# Patient Record
Sex: Female | Born: 1955 | Race: White | Hispanic: No | Smoking: Former smoker
Health system: Southern US, Community
[De-identification: ages and names within clinical notes are randomized; demographics above are authoritative.]

## PROBLEM LIST (undated history)

## (undated) DIAGNOSIS — I10 Essential (primary) hypertension: Secondary | ICD-10-CM

## (undated) DIAGNOSIS — G589 Mononeuropathy, unspecified: Secondary | ICD-10-CM

## (undated) DIAGNOSIS — B20 Human immunodeficiency virus [HIV] disease: Secondary | ICD-10-CM

## (undated) DIAGNOSIS — R079 Chest pain, unspecified: Secondary | ICD-10-CM

## (undated) DIAGNOSIS — E785 Hyperlipidemia, unspecified: Secondary | ICD-10-CM

## (undated) HISTORY — PX: BACK SURGERY: SHX140

## (undated) HISTORY — PX: BLADDER SUSPENSION: SHX72

## (undated) HISTORY — DX: Hyperlipidemia, unspecified: E78.5

## (undated) HISTORY — PX: ABDOMINAL HYSTERECTOMY: SHX81

## (undated) HISTORY — DX: Chest pain, unspecified: R07.9

---

## 1993-07-26 ENCOUNTER — Encounter (INDEPENDENT_AMBULATORY_CARE_PROVIDER_SITE_OTHER): Payer: Self-pay | Admitting: *Deleted

## 1993-07-26 LAB — CONVERTED CEMR LAB: CD4 Count: 190 microliters

## 1998-04-06 ENCOUNTER — Encounter: Admission: RE | Admit: 1998-04-06 | Discharge: 1998-04-06 | Payer: Self-pay | Admitting: Infectious Diseases

## 1998-04-24 ENCOUNTER — Encounter: Admission: RE | Admit: 1998-04-24 | Discharge: 1998-04-24 | Payer: Self-pay | Admitting: Infectious Diseases

## 1998-05-12 ENCOUNTER — Encounter: Admission: RE | Admit: 1998-05-12 | Discharge: 1998-05-12 | Payer: Self-pay | Admitting: Infectious Diseases

## 1998-05-27 ENCOUNTER — Encounter: Admission: RE | Admit: 1998-05-27 | Discharge: 1998-05-27 | Payer: Self-pay | Admitting: Infectious Diseases

## 1998-07-08 ENCOUNTER — Encounter: Admission: RE | Admit: 1998-07-08 | Discharge: 1998-07-08 | Payer: Self-pay | Admitting: Infectious Diseases

## 1998-08-19 ENCOUNTER — Inpatient Hospital Stay (HOSPITAL_COMMUNITY): Admission: RE | Admit: 1998-08-19 | Discharge: 1998-08-21 | Payer: Self-pay | Admitting: Infectious Diseases

## 1998-09-02 ENCOUNTER — Encounter: Admission: RE | Admit: 1998-09-02 | Discharge: 1998-09-02 | Payer: Self-pay | Admitting: Infectious Diseases

## 1998-10-12 ENCOUNTER — Encounter: Admission: RE | Admit: 1998-10-12 | Discharge: 1998-10-12 | Payer: Self-pay | Admitting: Infectious Diseases

## 1998-11-30 ENCOUNTER — Encounter: Admission: RE | Admit: 1998-11-30 | Discharge: 1998-11-30 | Payer: Self-pay | Admitting: Infectious Diseases

## 1998-12-14 ENCOUNTER — Encounter: Admission: RE | Admit: 1998-12-14 | Discharge: 1998-12-14 | Payer: Self-pay | Admitting: Infectious Diseases

## 1999-01-25 ENCOUNTER — Ambulatory Visit (HOSPITAL_COMMUNITY): Admission: RE | Admit: 1999-01-25 | Discharge: 1999-01-25 | Payer: Self-pay | Admitting: Infectious Diseases

## 1999-01-25 ENCOUNTER — Encounter: Payer: Self-pay | Admitting: Infectious Diseases

## 1999-01-25 ENCOUNTER — Encounter: Admission: RE | Admit: 1999-01-25 | Discharge: 1999-01-25 | Payer: Self-pay | Admitting: Infectious Diseases

## 1999-01-27 ENCOUNTER — Encounter: Payer: Self-pay | Admitting: Neurosurgery

## 1999-01-29 ENCOUNTER — Encounter: Payer: Self-pay | Admitting: Neurosurgery

## 1999-01-29 ENCOUNTER — Inpatient Hospital Stay (HOSPITAL_COMMUNITY): Admission: RE | Admit: 1999-01-29 | Discharge: 1999-01-30 | Payer: Self-pay | Admitting: Neurosurgery

## 1999-02-17 ENCOUNTER — Encounter: Admission: RE | Admit: 1999-02-17 | Discharge: 1999-02-17 | Payer: Self-pay | Admitting: Infectious Diseases

## 1999-03-09 ENCOUNTER — Ambulatory Visit (HOSPITAL_COMMUNITY): Admission: RE | Admit: 1999-03-09 | Discharge: 1999-03-09 | Payer: Self-pay | Admitting: Infectious Diseases

## 1999-03-22 ENCOUNTER — Encounter: Admission: RE | Admit: 1999-03-22 | Discharge: 1999-03-22 | Payer: Self-pay | Admitting: Infectious Diseases

## 1999-05-25 ENCOUNTER — Ambulatory Visit (HOSPITAL_COMMUNITY): Admission: RE | Admit: 1999-05-25 | Discharge: 1999-05-25 | Payer: Self-pay | Admitting: Infectious Diseases

## 1999-06-07 ENCOUNTER — Ambulatory Visit (HOSPITAL_COMMUNITY): Admission: RE | Admit: 1999-06-07 | Discharge: 1999-06-07 | Payer: Self-pay | Admitting: Infectious Diseases

## 1999-06-07 ENCOUNTER — Encounter: Admission: RE | Admit: 1999-06-07 | Discharge: 1999-06-07 | Payer: Self-pay | Admitting: Infectious Diseases

## 1999-07-27 ENCOUNTER — Encounter: Admission: RE | Admit: 1999-07-27 | Discharge: 1999-07-27 | Payer: Self-pay | Admitting: Infectious Diseases

## 1999-07-27 ENCOUNTER — Ambulatory Visit (HOSPITAL_COMMUNITY): Admission: RE | Admit: 1999-07-27 | Discharge: 1999-07-27 | Payer: Self-pay | Admitting: Infectious Diseases

## 1999-08-16 ENCOUNTER — Encounter: Admission: RE | Admit: 1999-08-16 | Discharge: 1999-08-16 | Payer: Self-pay | Admitting: Infectious Diseases

## 1999-10-21 ENCOUNTER — Ambulatory Visit (HOSPITAL_COMMUNITY): Admission: RE | Admit: 1999-10-21 | Discharge: 1999-10-21 | Payer: Self-pay | Admitting: Infectious Diseases

## 1999-10-21 ENCOUNTER — Encounter: Admission: RE | Admit: 1999-10-21 | Discharge: 1999-10-21 | Payer: Self-pay | Admitting: Infectious Diseases

## 1999-11-08 ENCOUNTER — Encounter: Admission: RE | Admit: 1999-11-08 | Discharge: 1999-11-08 | Payer: Self-pay | Admitting: Infectious Diseases

## 2000-02-01 ENCOUNTER — Encounter: Admission: RE | Admit: 2000-02-01 | Discharge: 2000-02-01 | Payer: Self-pay | Admitting: Infectious Diseases

## 2000-02-01 ENCOUNTER — Ambulatory Visit (HOSPITAL_COMMUNITY): Admission: RE | Admit: 2000-02-01 | Discharge: 2000-02-01 | Payer: Self-pay | Admitting: Infectious Diseases

## 2000-02-14 ENCOUNTER — Encounter: Admission: RE | Admit: 2000-02-14 | Discharge: 2000-02-14 | Payer: Self-pay | Admitting: Infectious Diseases

## 2000-04-12 ENCOUNTER — Encounter: Admission: RE | Admit: 2000-04-12 | Discharge: 2000-04-12 | Payer: Self-pay | Admitting: Infectious Diseases

## 2000-04-12 ENCOUNTER — Ambulatory Visit (HOSPITAL_COMMUNITY): Admission: RE | Admit: 2000-04-12 | Discharge: 2000-04-12 | Payer: Self-pay | Admitting: Infectious Diseases

## 2000-04-26 ENCOUNTER — Encounter: Admission: RE | Admit: 2000-04-26 | Discharge: 2000-04-26 | Payer: Self-pay | Admitting: Infectious Diseases

## 2000-05-03 ENCOUNTER — Encounter: Admission: RE | Admit: 2000-05-03 | Discharge: 2000-05-03 | Payer: Self-pay | Admitting: Infectious Diseases

## 2000-05-08 ENCOUNTER — Encounter: Admission: RE | Admit: 2000-05-08 | Discharge: 2000-05-08 | Payer: Self-pay | Admitting: Infectious Diseases

## 2000-07-12 ENCOUNTER — Ambulatory Visit (HOSPITAL_COMMUNITY): Admission: RE | Admit: 2000-07-12 | Discharge: 2000-07-12 | Payer: Self-pay | Admitting: Infectious Diseases

## 2000-07-12 ENCOUNTER — Encounter: Admission: RE | Admit: 2000-07-12 | Discharge: 2000-07-12 | Payer: Self-pay | Admitting: Infectious Diseases

## 2000-07-31 ENCOUNTER — Encounter: Admission: RE | Admit: 2000-07-31 | Discharge: 2000-07-31 | Payer: Self-pay | Admitting: Infectious Diseases

## 2000-09-11 ENCOUNTER — Encounter: Admission: RE | Admit: 2000-09-11 | Discharge: 2000-09-11 | Payer: Self-pay | Admitting: Infectious Diseases

## 2000-09-11 ENCOUNTER — Ambulatory Visit (HOSPITAL_COMMUNITY): Admission: RE | Admit: 2000-09-11 | Discharge: 2000-09-11 | Payer: Self-pay | Admitting: Infectious Diseases

## 2000-09-25 ENCOUNTER — Encounter: Admission: RE | Admit: 2000-09-25 | Discharge: 2000-09-25 | Payer: Self-pay | Admitting: Infectious Diseases

## 2000-10-17 ENCOUNTER — Encounter: Admission: RE | Admit: 2000-10-17 | Discharge: 2000-10-17 | Payer: Self-pay | Admitting: Hematology and Oncology

## 2000-12-11 ENCOUNTER — Encounter: Admission: RE | Admit: 2000-12-11 | Discharge: 2000-12-11 | Payer: Self-pay | Admitting: Internal Medicine

## 2000-12-11 ENCOUNTER — Ambulatory Visit (HOSPITAL_COMMUNITY): Admission: RE | Admit: 2000-12-11 | Discharge: 2000-12-11 | Payer: Self-pay | Admitting: Infectious Diseases

## 2000-12-27 ENCOUNTER — Encounter: Admission: RE | Admit: 2000-12-27 | Discharge: 2000-12-27 | Payer: Self-pay | Admitting: Infectious Diseases

## 2001-02-08 ENCOUNTER — Encounter: Admission: RE | Admit: 2001-02-08 | Discharge: 2001-02-08 | Payer: Self-pay

## 2001-03-20 ENCOUNTER — Encounter: Admission: RE | Admit: 2001-03-20 | Discharge: 2001-03-20 | Payer: Self-pay | Admitting: Infectious Diseases

## 2001-03-20 ENCOUNTER — Ambulatory Visit (HOSPITAL_COMMUNITY): Admission: RE | Admit: 2001-03-20 | Discharge: 2001-03-20 | Payer: Self-pay | Admitting: Infectious Diseases

## 2001-04-06 ENCOUNTER — Encounter: Admission: RE | Admit: 2001-04-06 | Discharge: 2001-04-06 | Payer: Self-pay

## 2001-04-20 ENCOUNTER — Encounter: Admission: RE | Admit: 2001-04-20 | Discharge: 2001-04-20 | Payer: Self-pay | Admitting: Infectious Diseases

## 2001-05-21 ENCOUNTER — Encounter: Admission: RE | Admit: 2001-05-21 | Discharge: 2001-05-21 | Payer: Self-pay | Admitting: Infectious Diseases

## 2001-07-02 ENCOUNTER — Encounter: Admission: RE | Admit: 2001-07-02 | Discharge: 2001-07-02 | Payer: Self-pay | Admitting: Infectious Diseases

## 2001-07-02 ENCOUNTER — Ambulatory Visit (HOSPITAL_COMMUNITY): Admission: RE | Admit: 2001-07-02 | Discharge: 2001-07-02 | Payer: Self-pay | Admitting: Infectious Diseases

## 2001-07-16 ENCOUNTER — Encounter: Admission: RE | Admit: 2001-07-16 | Discharge: 2001-07-16 | Payer: Self-pay | Admitting: Infectious Diseases

## 2001-10-01 ENCOUNTER — Ambulatory Visit (HOSPITAL_COMMUNITY): Admission: RE | Admit: 2001-10-01 | Discharge: 2001-10-01 | Payer: Self-pay | Admitting: Infectious Diseases

## 2001-10-01 ENCOUNTER — Encounter: Admission: RE | Admit: 2001-10-01 | Discharge: 2001-10-01 | Payer: Self-pay | Admitting: Infectious Diseases

## 2001-10-15 ENCOUNTER — Encounter: Admission: RE | Admit: 2001-10-15 | Discharge: 2001-10-15 | Payer: Self-pay | Admitting: Infectious Diseases

## 2002-01-14 ENCOUNTER — Encounter: Admission: RE | Admit: 2002-01-14 | Discharge: 2002-01-14 | Payer: Self-pay | Admitting: Infectious Diseases

## 2002-01-14 ENCOUNTER — Ambulatory Visit (HOSPITAL_COMMUNITY): Admission: RE | Admit: 2002-01-14 | Discharge: 2002-01-14 | Payer: Self-pay | Admitting: Infectious Diseases

## 2002-01-28 ENCOUNTER — Encounter: Admission: RE | Admit: 2002-01-28 | Discharge: 2002-01-28 | Payer: Self-pay | Admitting: Infectious Diseases

## 2002-02-01 ENCOUNTER — Encounter: Payer: Self-pay | Admitting: Infectious Diseases

## 2002-02-01 ENCOUNTER — Ambulatory Visit (HOSPITAL_COMMUNITY): Admission: RE | Admit: 2002-02-01 | Discharge: 2002-02-01 | Payer: Self-pay | Admitting: Infectious Diseases

## 2002-02-18 ENCOUNTER — Encounter: Admission: RE | Admit: 2002-02-18 | Discharge: 2002-02-18 | Payer: Self-pay | Admitting: Infectious Diseases

## 2002-02-27 ENCOUNTER — Other Ambulatory Visit: Admission: RE | Admit: 2002-02-27 | Discharge: 2002-02-27 | Payer: Self-pay | Admitting: Obstetrics and Gynecology

## 2002-03-18 ENCOUNTER — Encounter: Admission: RE | Admit: 2002-03-18 | Discharge: 2002-03-18 | Payer: Self-pay | Admitting: Infectious Diseases

## 2002-05-14 ENCOUNTER — Ambulatory Visit (HOSPITAL_COMMUNITY): Admission: RE | Admit: 2002-05-14 | Discharge: 2002-05-14 | Payer: Self-pay | Admitting: Infectious Diseases

## 2002-05-14 ENCOUNTER — Encounter: Admission: RE | Admit: 2002-05-14 | Discharge: 2002-05-14 | Payer: Self-pay | Admitting: Infectious Diseases

## 2002-05-15 ENCOUNTER — Ambulatory Visit (HOSPITAL_COMMUNITY): Admission: RE | Admit: 2002-05-15 | Discharge: 2002-05-15 | Payer: Self-pay | Admitting: Infectious Diseases

## 2002-05-15 ENCOUNTER — Encounter: Payer: Self-pay | Admitting: Infectious Diseases

## 2002-06-03 ENCOUNTER — Encounter: Admission: RE | Admit: 2002-06-03 | Discharge: 2002-06-03 | Payer: Self-pay | Admitting: Infectious Diseases

## 2002-09-02 ENCOUNTER — Ambulatory Visit (HOSPITAL_COMMUNITY): Admission: RE | Admit: 2002-09-02 | Discharge: 2002-09-02 | Payer: Self-pay | Admitting: Infectious Diseases

## 2002-09-02 ENCOUNTER — Encounter: Admission: RE | Admit: 2002-09-02 | Discharge: 2002-09-02 | Payer: Self-pay | Admitting: Internal Medicine

## 2002-09-16 ENCOUNTER — Encounter: Admission: RE | Admit: 2002-09-16 | Discharge: 2002-09-16 | Payer: Self-pay | Admitting: Infectious Diseases

## 2002-10-18 ENCOUNTER — Encounter: Admission: RE | Admit: 2002-10-18 | Discharge: 2002-10-18 | Payer: Self-pay | Admitting: Internal Medicine

## 2002-12-30 ENCOUNTER — Encounter: Admission: RE | Admit: 2002-12-30 | Discharge: 2002-12-30 | Payer: Self-pay | Admitting: Infectious Diseases

## 2002-12-30 ENCOUNTER — Ambulatory Visit (HOSPITAL_COMMUNITY): Admission: RE | Admit: 2002-12-30 | Discharge: 2002-12-30 | Payer: Self-pay | Admitting: Infectious Diseases

## 2003-01-13 ENCOUNTER — Encounter: Admission: RE | Admit: 2003-01-13 | Discharge: 2003-01-13 | Payer: Self-pay | Admitting: Infectious Diseases

## 2003-05-13 ENCOUNTER — Encounter (INDEPENDENT_AMBULATORY_CARE_PROVIDER_SITE_OTHER): Payer: Self-pay | Admitting: Infectious Diseases

## 2003-05-13 ENCOUNTER — Encounter: Admission: RE | Admit: 2003-05-13 | Discharge: 2003-05-13 | Payer: Self-pay | Admitting: Infectious Diseases

## 2003-06-04 ENCOUNTER — Encounter: Admission: RE | Admit: 2003-06-04 | Discharge: 2003-06-04 | Payer: Self-pay | Admitting: Infectious Diseases

## 2003-06-04 ENCOUNTER — Encounter: Payer: Self-pay | Admitting: Neurosurgery

## 2003-06-04 ENCOUNTER — Ambulatory Visit (HOSPITAL_COMMUNITY): Admission: RE | Admit: 2003-06-04 | Discharge: 2003-06-04 | Payer: Self-pay | Admitting: Neurosurgery

## 2003-07-18 ENCOUNTER — Observation Stay (HOSPITAL_COMMUNITY): Admission: RE | Admit: 2003-07-18 | Discharge: 2003-07-19 | Payer: Self-pay | Admitting: Neurosurgery

## 2003-07-18 ENCOUNTER — Encounter: Payer: Self-pay | Admitting: Neurosurgery

## 2003-09-03 ENCOUNTER — Encounter: Admission: RE | Admit: 2003-09-03 | Discharge: 2003-09-03 | Payer: Self-pay | Admitting: Infectious Diseases

## 2003-09-03 ENCOUNTER — Encounter (INDEPENDENT_AMBULATORY_CARE_PROVIDER_SITE_OTHER): Payer: Self-pay | Admitting: Infectious Diseases

## 2003-09-03 ENCOUNTER — Ambulatory Visit (HOSPITAL_COMMUNITY): Admission: RE | Admit: 2003-09-03 | Discharge: 2003-09-03 | Payer: Self-pay | Admitting: Infectious Diseases

## 2003-09-17 ENCOUNTER — Encounter: Admission: RE | Admit: 2003-09-17 | Discharge: 2003-09-17 | Payer: Self-pay | Admitting: Infectious Diseases

## 2003-10-03 ENCOUNTER — Inpatient Hospital Stay (HOSPITAL_COMMUNITY): Admission: EM | Admit: 2003-10-03 | Discharge: 2003-10-04 | Payer: Self-pay | Admitting: Emergency Medicine

## 2003-10-03 ENCOUNTER — Encounter: Payer: Self-pay | Admitting: Emergency Medicine

## 2003-10-03 ENCOUNTER — Encounter: Payer: Self-pay | Admitting: Internal Medicine

## 2003-10-08 ENCOUNTER — Encounter: Admission: RE | Admit: 2003-10-08 | Discharge: 2003-10-08 | Payer: Self-pay | Admitting: Infectious Diseases

## 2003-10-17 ENCOUNTER — Encounter: Payer: Self-pay | Admitting: Neurosurgery

## 2003-10-17 ENCOUNTER — Ambulatory Visit (HOSPITAL_COMMUNITY): Admission: RE | Admit: 2003-10-17 | Discharge: 2003-10-17 | Payer: Self-pay | Admitting: Neurosurgery

## 2003-10-20 ENCOUNTER — Encounter: Admission: RE | Admit: 2003-10-20 | Discharge: 2003-10-20 | Payer: Self-pay | Admitting: Infectious Diseases

## 2003-10-29 ENCOUNTER — Ambulatory Visit (HOSPITAL_COMMUNITY): Admission: RE | Admit: 2003-10-29 | Discharge: 2003-10-29 | Payer: Self-pay | Admitting: Neurosurgery

## 2003-12-15 ENCOUNTER — Ambulatory Visit (HOSPITAL_COMMUNITY): Admission: RE | Admit: 2003-12-15 | Discharge: 2003-12-15 | Payer: Self-pay | Admitting: Infectious Diseases

## 2003-12-15 ENCOUNTER — Encounter: Admission: RE | Admit: 2003-12-15 | Discharge: 2003-12-15 | Payer: Self-pay | Admitting: Infectious Diseases

## 2003-12-15 ENCOUNTER — Encounter (INDEPENDENT_AMBULATORY_CARE_PROVIDER_SITE_OTHER): Payer: Self-pay | Admitting: Infectious Diseases

## 2003-12-29 ENCOUNTER — Encounter: Admission: RE | Admit: 2003-12-29 | Discharge: 2003-12-29 | Payer: Self-pay | Admitting: Infectious Diseases

## 2004-01-05 ENCOUNTER — Encounter: Admission: RE | Admit: 2004-01-05 | Discharge: 2004-01-05 | Payer: Self-pay | Admitting: Infectious Diseases

## 2004-02-10 ENCOUNTER — Other Ambulatory Visit: Admission: RE | Admit: 2004-02-10 | Discharge: 2004-02-10 | Payer: Self-pay | Admitting: Obstetrics and Gynecology

## 2004-02-13 ENCOUNTER — Encounter: Admission: RE | Admit: 2004-02-13 | Discharge: 2004-02-13 | Payer: Self-pay | Admitting: Infectious Diseases

## 2004-02-20 ENCOUNTER — Encounter: Admission: RE | Admit: 2004-02-20 | Discharge: 2004-02-20 | Payer: Self-pay | Admitting: Urology

## 2004-02-24 ENCOUNTER — Ambulatory Visit (HOSPITAL_BASED_OUTPATIENT_CLINIC_OR_DEPARTMENT_OTHER): Admission: RE | Admit: 2004-02-24 | Discharge: 2004-02-24 | Payer: Self-pay | Admitting: Urology

## 2004-02-24 ENCOUNTER — Ambulatory Visit (HOSPITAL_COMMUNITY): Admission: RE | Admit: 2004-02-24 | Discharge: 2004-02-24 | Payer: Self-pay | Admitting: Urology

## 2004-03-15 ENCOUNTER — Encounter: Admission: RE | Admit: 2004-03-15 | Discharge: 2004-03-15 | Payer: Self-pay | Admitting: Infectious Diseases

## 2004-03-15 ENCOUNTER — Ambulatory Visit (HOSPITAL_COMMUNITY): Admission: RE | Admit: 2004-03-15 | Discharge: 2004-03-15 | Payer: Self-pay | Admitting: Infectious Diseases

## 2004-03-29 ENCOUNTER — Encounter: Admission: RE | Admit: 2004-03-29 | Discharge: 2004-03-29 | Payer: Self-pay | Admitting: Infectious Diseases

## 2004-07-19 ENCOUNTER — Encounter: Admission: RE | Admit: 2004-07-19 | Discharge: 2004-07-19 | Payer: Self-pay | Admitting: Infectious Diseases

## 2004-07-19 ENCOUNTER — Ambulatory Visit (HOSPITAL_COMMUNITY): Admission: RE | Admit: 2004-07-19 | Discharge: 2004-07-19 | Payer: Self-pay | Admitting: Infectious Diseases

## 2004-08-02 ENCOUNTER — Encounter: Admission: RE | Admit: 2004-08-02 | Discharge: 2004-08-02 | Payer: Self-pay | Admitting: Infectious Diseases

## 2004-10-01 ENCOUNTER — Ambulatory Visit (HOSPITAL_COMMUNITY): Admission: RE | Admit: 2004-10-01 | Discharge: 2004-10-01 | Payer: Self-pay | Admitting: Neurosurgery

## 2004-10-06 ENCOUNTER — Ambulatory Visit: Payer: Self-pay | Admitting: Infectious Diseases

## 2004-11-12 ENCOUNTER — Encounter
Admission: RE | Admit: 2004-11-12 | Discharge: 2004-11-16 | Payer: Self-pay | Admitting: Physical Medicine & Rehabilitation

## 2004-11-15 ENCOUNTER — Ambulatory Visit: Payer: Self-pay | Admitting: Infectious Diseases

## 2004-11-15 ENCOUNTER — Ambulatory Visit (HOSPITAL_COMMUNITY): Admission: RE | Admit: 2004-11-15 | Discharge: 2004-11-15 | Payer: Self-pay | Admitting: Infectious Diseases

## 2004-11-29 ENCOUNTER — Ambulatory Visit: Payer: Self-pay | Admitting: Infectious Diseases

## 2005-01-27 ENCOUNTER — Emergency Department (HOSPITAL_COMMUNITY): Admission: EM | Admit: 2005-01-27 | Discharge: 2005-01-27 | Payer: Self-pay | Admitting: Family Medicine

## 2005-02-28 ENCOUNTER — Ambulatory Visit: Payer: Self-pay | Admitting: Infectious Diseases

## 2005-02-28 ENCOUNTER — Ambulatory Visit (HOSPITAL_COMMUNITY): Admission: RE | Admit: 2005-02-28 | Discharge: 2005-02-28 | Payer: Self-pay | Admitting: Infectious Diseases

## 2005-03-21 ENCOUNTER — Ambulatory Visit: Payer: Self-pay | Admitting: Infectious Diseases

## 2005-08-22 ENCOUNTER — Ambulatory Visit (HOSPITAL_COMMUNITY): Admission: RE | Admit: 2005-08-22 | Discharge: 2005-08-22 | Payer: Self-pay | Admitting: Infectious Diseases

## 2005-08-22 ENCOUNTER — Ambulatory Visit: Payer: Self-pay | Admitting: Infectious Diseases

## 2005-09-05 ENCOUNTER — Ambulatory Visit: Payer: Self-pay | Admitting: Infectious Diseases

## 2005-10-14 ENCOUNTER — Ambulatory Visit: Payer: Self-pay | Admitting: Internal Medicine

## 2005-12-14 ENCOUNTER — Ambulatory Visit: Payer: Self-pay | Admitting: Infectious Diseases

## 2005-12-14 ENCOUNTER — Encounter (INDEPENDENT_AMBULATORY_CARE_PROVIDER_SITE_OTHER): Payer: Self-pay | Admitting: *Deleted

## 2005-12-14 ENCOUNTER — Ambulatory Visit (HOSPITAL_COMMUNITY): Admission: RE | Admit: 2005-12-14 | Discharge: 2005-12-14 | Payer: Self-pay | Admitting: Infectious Diseases

## 2005-12-14 LAB — CONVERTED CEMR LAB: CD4 Count: 770 microliters

## 2006-01-02 ENCOUNTER — Ambulatory Visit: Payer: Self-pay | Admitting: Infectious Diseases

## 2006-05-16 ENCOUNTER — Encounter (INDEPENDENT_AMBULATORY_CARE_PROVIDER_SITE_OTHER): Payer: Self-pay | Admitting: *Deleted

## 2006-05-16 ENCOUNTER — Ambulatory Visit: Payer: Self-pay | Admitting: Infectious Diseases

## 2006-05-16 ENCOUNTER — Encounter: Admission: RE | Admit: 2006-05-16 | Discharge: 2006-05-16 | Payer: Self-pay | Admitting: Infectious Diseases

## 2006-05-30 ENCOUNTER — Ambulatory Visit: Payer: Self-pay | Admitting: Infectious Diseases

## 2006-07-26 ENCOUNTER — Encounter: Payer: Self-pay | Admitting: Internal Medicine

## 2006-08-06 ENCOUNTER — Emergency Department (HOSPITAL_COMMUNITY): Admission: EM | Admit: 2006-08-06 | Discharge: 2006-08-06 | Payer: Self-pay | Admitting: Family Medicine

## 2006-09-21 ENCOUNTER — Emergency Department (HOSPITAL_COMMUNITY): Admission: EM | Admit: 2006-09-21 | Discharge: 2006-09-21 | Payer: Self-pay | Admitting: Family Medicine

## 2006-09-27 ENCOUNTER — Emergency Department (HOSPITAL_COMMUNITY): Admission: EM | Admit: 2006-09-27 | Discharge: 2006-09-27 | Payer: Self-pay | Admitting: Family Medicine

## 2006-09-27 ENCOUNTER — Encounter: Admission: RE | Admit: 2006-09-27 | Discharge: 2006-09-27 | Payer: Self-pay | Admitting: Infectious Diseases

## 2006-09-27 ENCOUNTER — Ambulatory Visit: Payer: Self-pay | Admitting: Infectious Diseases

## 2006-09-27 ENCOUNTER — Encounter (INDEPENDENT_AMBULATORY_CARE_PROVIDER_SITE_OTHER): Payer: Self-pay | Admitting: *Deleted

## 2006-09-27 LAB — CONVERTED CEMR LAB: HIV 1 RNA Quant: 49 copies/mL

## 2006-10-11 ENCOUNTER — Ambulatory Visit: Payer: Self-pay | Admitting: Infectious Diseases

## 2006-10-12 ENCOUNTER — Encounter (INDEPENDENT_AMBULATORY_CARE_PROVIDER_SITE_OTHER): Payer: Self-pay | Admitting: Infectious Diseases

## 2006-10-27 DIAGNOSIS — M5030 Other cervical disc degeneration, unspecified cervical region: Secondary | ICD-10-CM

## 2006-10-27 DIAGNOSIS — K2970 Gastritis, unspecified, without bleeding: Secondary | ICD-10-CM | POA: Insufficient documentation

## 2006-10-27 DIAGNOSIS — B20 Human immunodeficiency virus [HIV] disease: Secondary | ICD-10-CM

## 2006-10-27 DIAGNOSIS — IMO0002 Reserved for concepts with insufficient information to code with codable children: Secondary | ICD-10-CM

## 2006-10-27 DIAGNOSIS — K2990 Gastroduodenitis, unspecified, without bleeding: Secondary | ICD-10-CM

## 2006-10-27 DIAGNOSIS — R300 Dysuria: Secondary | ICD-10-CM | POA: Insufficient documentation

## 2006-10-27 DIAGNOSIS — F429 Obsessive-compulsive disorder, unspecified: Secondary | ICD-10-CM | POA: Insufficient documentation

## 2006-10-27 DIAGNOSIS — R319 Hematuria, unspecified: Secondary | ICD-10-CM | POA: Insufficient documentation

## 2006-12-26 HISTORY — PX: OTHER SURGICAL HISTORY: SHX169

## 2007-02-06 ENCOUNTER — Emergency Department (HOSPITAL_COMMUNITY): Admission: EM | Admit: 2007-02-06 | Discharge: 2007-02-06 | Payer: Self-pay | Admitting: Family Medicine

## 2007-02-12 ENCOUNTER — Ambulatory Visit: Payer: Self-pay | Admitting: Internal Medicine

## 2007-02-12 ENCOUNTER — Inpatient Hospital Stay (HOSPITAL_COMMUNITY): Admission: EM | Admit: 2007-02-12 | Discharge: 2007-02-15 | Payer: Self-pay | Admitting: Emergency Medicine

## 2007-02-12 DIAGNOSIS — J13 Pneumonia due to Streptococcus pneumoniae: Secondary | ICD-10-CM | POA: Insufficient documentation

## 2007-02-19 ENCOUNTER — Encounter (INDEPENDENT_AMBULATORY_CARE_PROVIDER_SITE_OTHER): Payer: Self-pay | Admitting: *Deleted

## 2007-02-19 LAB — CONVERTED CEMR LAB

## 2007-02-26 ENCOUNTER — Encounter (INDEPENDENT_AMBULATORY_CARE_PROVIDER_SITE_OTHER): Payer: Self-pay | Admitting: Infectious Diseases

## 2007-03-04 ENCOUNTER — Encounter (INDEPENDENT_AMBULATORY_CARE_PROVIDER_SITE_OTHER): Payer: Self-pay | Admitting: *Deleted

## 2007-03-12 ENCOUNTER — Ambulatory Visit: Payer: Self-pay | Admitting: Infectious Diseases

## 2007-03-26 ENCOUNTER — Ambulatory Visit (HOSPITAL_COMMUNITY): Admission: RE | Admit: 2007-03-26 | Discharge: 2007-03-26 | Payer: Self-pay | Admitting: Infectious Diseases

## 2007-03-26 ENCOUNTER — Ambulatory Visit: Payer: Self-pay | Admitting: Infectious Diseases

## 2007-04-10 ENCOUNTER — Emergency Department (HOSPITAL_COMMUNITY): Admission: EM | Admit: 2007-04-10 | Discharge: 2007-04-10 | Payer: Self-pay | Admitting: Emergency Medicine

## 2007-04-23 ENCOUNTER — Telehealth (INDEPENDENT_AMBULATORY_CARE_PROVIDER_SITE_OTHER): Payer: Self-pay | Admitting: Infectious Diseases

## 2007-06-07 ENCOUNTER — Encounter (INDEPENDENT_AMBULATORY_CARE_PROVIDER_SITE_OTHER): Payer: Self-pay | Admitting: Infectious Diseases

## 2007-07-26 ENCOUNTER — Encounter: Admission: RE | Admit: 2007-07-26 | Discharge: 2007-07-26 | Payer: Self-pay | Admitting: Internal Medicine

## 2007-07-26 ENCOUNTER — Ambulatory Visit: Payer: Self-pay | Admitting: Internal Medicine

## 2007-07-27 ENCOUNTER — Encounter: Payer: Self-pay | Admitting: Internal Medicine

## 2007-07-27 LAB — CONVERTED CEMR LAB
ALT: 12 units/L (ref 0–35)
AST: 15 units/L (ref 0–37)
Alkaline Phosphatase: 67 units/L (ref 39–117)
Basophils Absolute: 0 10*3/uL (ref 0.0–0.1)
Basophils Relative: 0 % (ref 0–1)
Chloride: 108 meq/L (ref 96–112)
Creatinine, Ser: 0.69 mg/dL (ref 0.40–1.20)
Eosinophils Relative: 3 % (ref 0–5)
Hemoglobin: 14.1 g/dL (ref 12.0–15.0)
MCHC: 32.1 g/dL (ref 30.0–36.0)
Monocytes Absolute: 0.4 10*3/uL (ref 0.2–0.7)
Neutro Abs: 2 10*3/uL (ref 1.7–7.7)
RDW: 13.5 % (ref 11.5–14.0)
Total Bilirubin: 0.5 mg/dL (ref 0.3–1.2)

## 2007-08-10 ENCOUNTER — Ambulatory Visit: Payer: Self-pay | Admitting: Internal Medicine

## 2007-08-10 DIAGNOSIS — R197 Diarrhea, unspecified: Secondary | ICD-10-CM

## 2007-08-14 ENCOUNTER — Ambulatory Visit (HOSPITAL_COMMUNITY): Admission: RE | Admit: 2007-08-14 | Discharge: 2007-08-14 | Payer: Self-pay | Admitting: Internal Medicine

## 2007-08-14 ENCOUNTER — Encounter: Payer: Self-pay | Admitting: Internal Medicine

## 2007-08-14 ENCOUNTER — Ambulatory Visit: Payer: Self-pay | Admitting: Infectious Disease

## 2007-08-29 ENCOUNTER — Telehealth: Payer: Self-pay | Admitting: Internal Medicine

## 2007-09-07 ENCOUNTER — Ambulatory Visit: Payer: Self-pay | Admitting: Internal Medicine

## 2007-09-10 ENCOUNTER — Ambulatory Visit: Payer: Self-pay | Admitting: Internal Medicine

## 2007-09-24 ENCOUNTER — Telehealth: Payer: Self-pay | Admitting: Internal Medicine

## 2007-09-25 ENCOUNTER — Telehealth: Payer: Self-pay | Admitting: Internal Medicine

## 2007-09-28 ENCOUNTER — Encounter: Payer: Self-pay | Admitting: Internal Medicine

## 2007-10-09 ENCOUNTER — Encounter: Payer: Self-pay | Admitting: Internal Medicine

## 2007-10-19 ENCOUNTER — Telehealth: Payer: Self-pay | Admitting: Internal Medicine

## 2007-10-22 ENCOUNTER — Ambulatory Visit: Payer: Self-pay | Admitting: Internal Medicine

## 2007-10-22 ENCOUNTER — Encounter: Admission: RE | Admit: 2007-10-22 | Discharge: 2007-10-22 | Payer: Self-pay | Admitting: Internal Medicine

## 2007-10-22 LAB — CONVERTED CEMR LAB
AST: 17 units/L (ref 0–37)
Alkaline Phosphatase: 66 units/L (ref 39–117)
BUN: 14 mg/dL (ref 6–23)
Basophils Relative: 0 % (ref 0–1)
Calcium: 9.2 mg/dL (ref 8.4–10.5)
Chloride: 106 meq/L (ref 96–112)
Creatinine, Ser: 0.78 mg/dL (ref 0.40–1.20)
Eosinophils Absolute: 0.1 10*3/uL (ref 0.0–0.7)
HIV-1 RNA Quant, Log: <1.7 (ref <1.70)
Hemoglobin: 14.6 g/dL (ref 12.0–15.0)
MCHC: 32.7 g/dL (ref 30.0–36.0)
MCV: 100 fL (ref 78.0–100.0)
Monocytes Absolute: 0.3 10*3/uL (ref 0.2–0.7)
Monocytes Relative: 7 % (ref 3–11)
RBC: 4.46 M/uL (ref 3.87–5.11)

## 2007-11-06 ENCOUNTER — Ambulatory Visit: Payer: Self-pay | Admitting: Internal Medicine

## 2007-11-06 DIAGNOSIS — L0233 Carbuncle of buttock: Secondary | ICD-10-CM

## 2007-11-13 ENCOUNTER — Encounter: Payer: Self-pay | Admitting: Internal Medicine

## 2007-11-27 ENCOUNTER — Ambulatory Visit (HOSPITAL_BASED_OUTPATIENT_CLINIC_OR_DEPARTMENT_OTHER): Admission: RE | Admit: 2007-11-27 | Discharge: 2007-11-27 | Payer: Self-pay | Admitting: Urology

## 2008-01-04 IMAGING — CR DG CHEST 2V
2 series · 2 of 2 positions shown · non-contrast
Comparison: none

CLINICAL DATA: Pneumococcal pneumonia

Chest 2 view:
Comparison 02/15/2007 and earlier films. Cervical plate partially seen. Lungs are
clear. No effusion. Heart size and mediastinal contour normal.

[w chest pa]
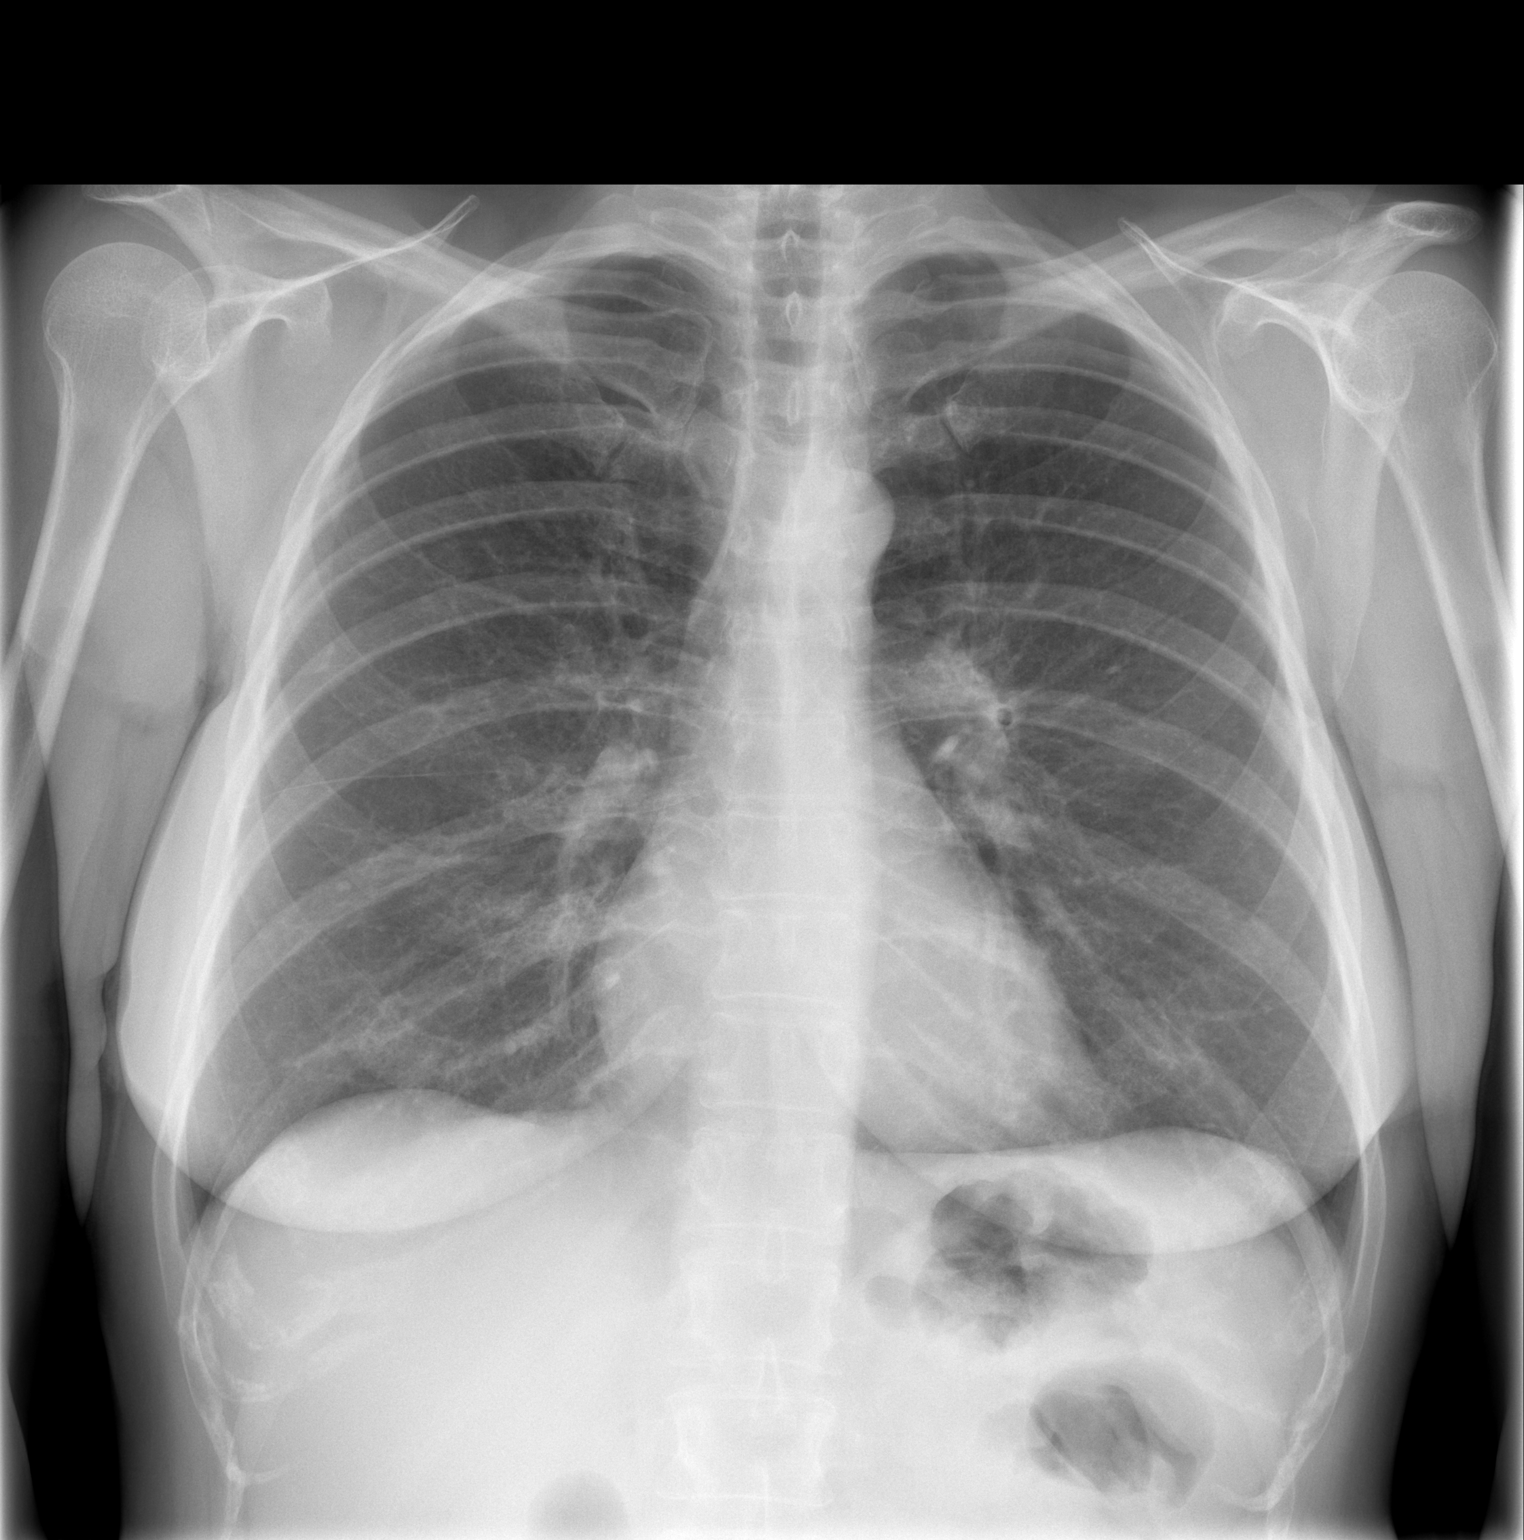

[w chest lat]
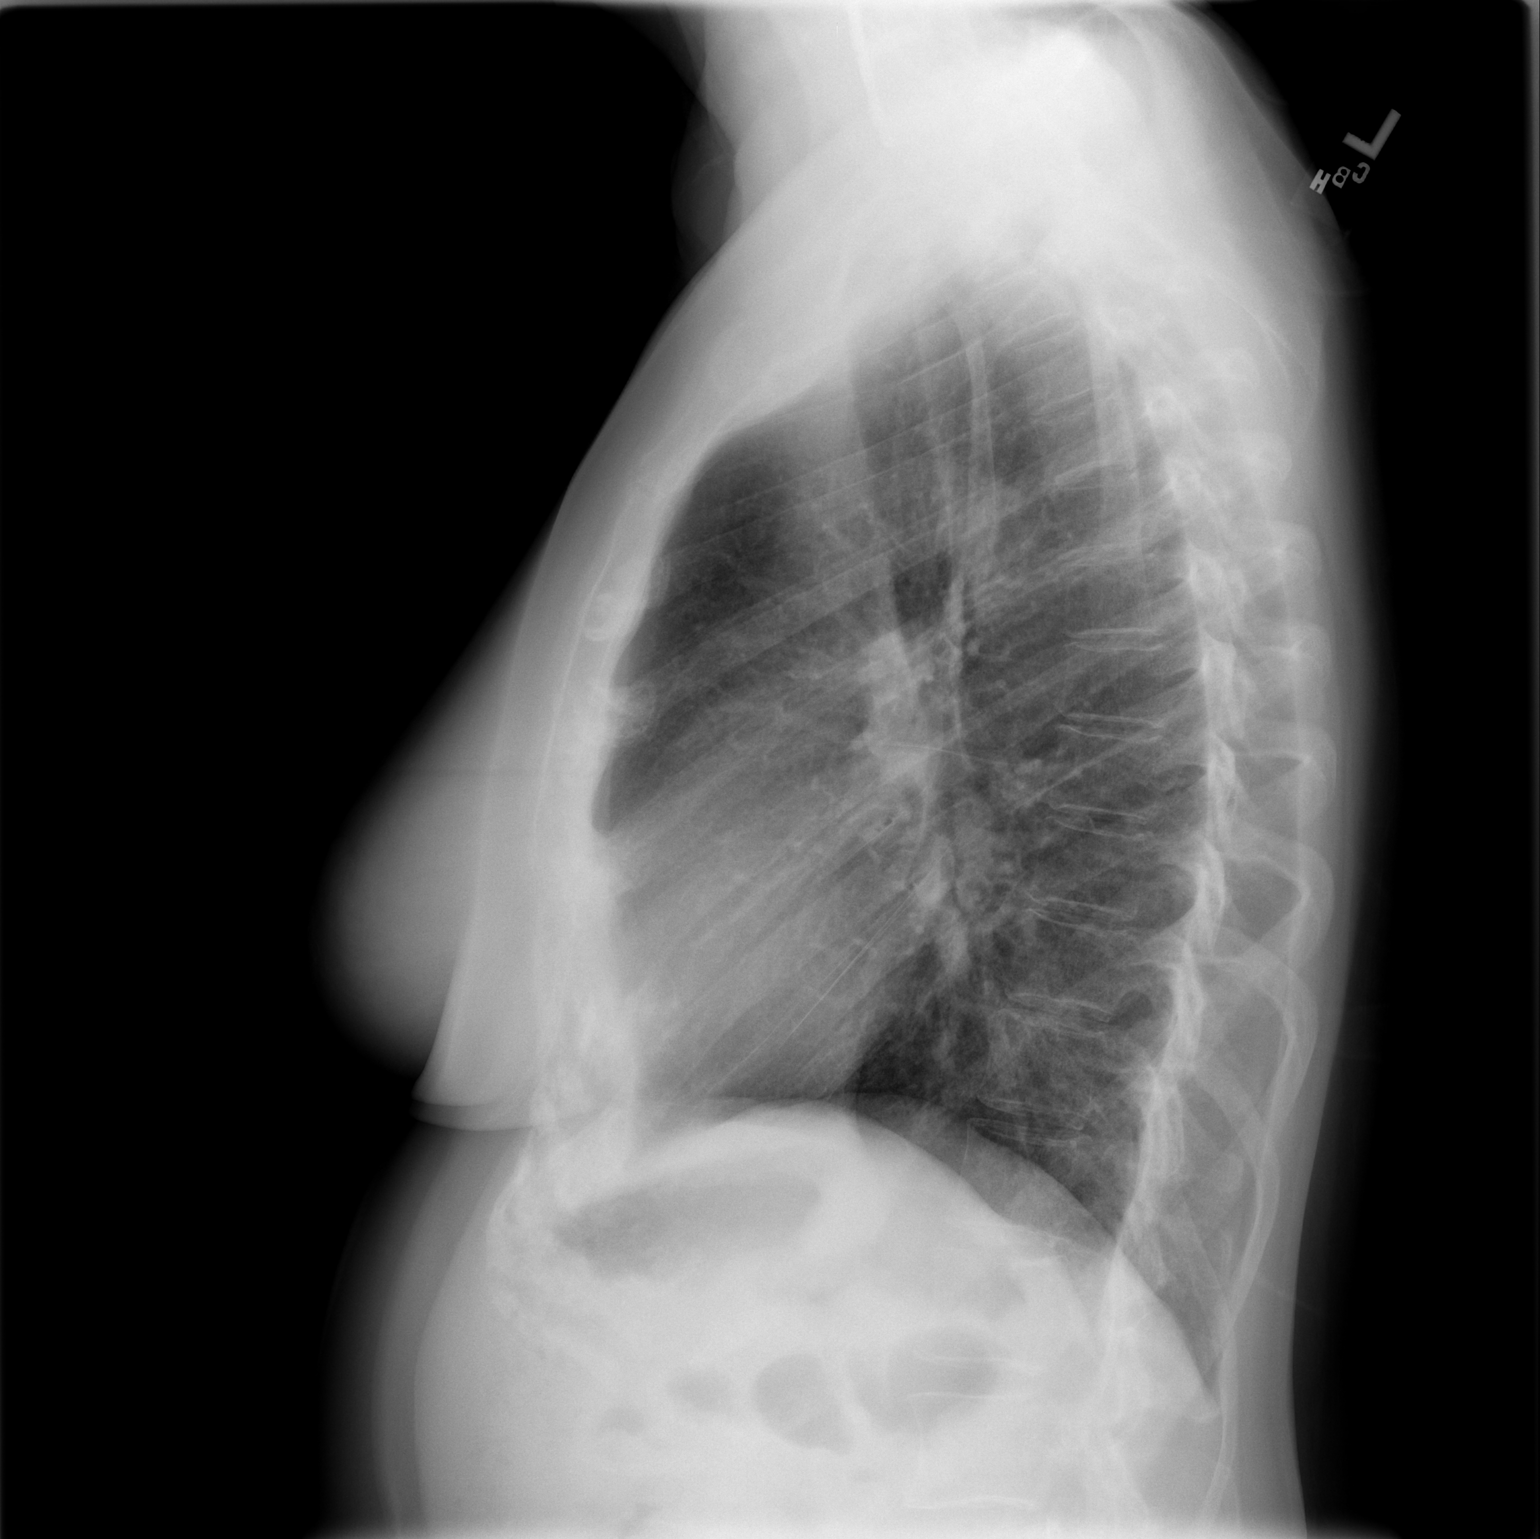

[2 of 2 positions shown; findings below may reference images not displayed]

IMPRESSION: 1. No acute disease

## 2008-02-04 ENCOUNTER — Encounter: Admission: RE | Admit: 2008-02-04 | Discharge: 2008-02-04 | Payer: Self-pay | Admitting: Internal Medicine

## 2008-02-04 ENCOUNTER — Ambulatory Visit: Payer: Self-pay | Admitting: Internal Medicine

## 2008-02-04 LAB — CONVERTED CEMR LAB
Albumin: 4.2 g/dL (ref 3.5–5.2)
Alkaline Phosphatase: 81 units/L (ref 39–117)
BUN: 12 mg/dL (ref 6–23)
CO2: 25 meq/L (ref 19–32)
Calcium: 9.5 mg/dL (ref 8.4–10.5)
Chloride: 104 meq/L (ref 96–112)
Eosinophils Absolute: 0.1 10*3/uL (ref 0.0–0.7)
Glucose, Bld: 93 mg/dL (ref 70–99)
HIV 1 RNA Quant: 68 copies/mL — ABNORMAL HIGH (ref <50)
HIV-1 RNA Quant, Log: 1.83 — ABNORMAL HIGH (ref <1.70)
Lymphocytes Relative: 37 % (ref 12–46)
Lymphs Abs: 1.5 10*3/uL (ref 0.7–4.0)
MCV: 96.6 fL (ref 78.0–100.0)
Monocytes Relative: 8 % (ref 3–12)
Neutrophils Relative %: 53 % (ref 43–77)
Potassium: 4.7 meq/L (ref 3.5–5.3)
RBC: 4.7 M/uL (ref 3.87–5.11)
Total Protein: 6.9 g/dL (ref 6.0–8.3)
WBC: 3.9 10*3/uL — ABNORMAL LOW (ref 4.0–10.5)

## 2008-02-19 ENCOUNTER — Encounter (INDEPENDENT_AMBULATORY_CARE_PROVIDER_SITE_OTHER): Payer: Self-pay | Admitting: *Deleted

## 2008-02-20 ENCOUNTER — Ambulatory Visit: Payer: Self-pay | Admitting: Internal Medicine

## 2008-02-20 LAB — CONVERTED CEMR LAB

## 2008-04-10 ENCOUNTER — Ambulatory Visit: Payer: Self-pay | Admitting: Infectious Disease

## 2008-05-20 ENCOUNTER — Encounter: Admission: RE | Admit: 2008-05-20 | Discharge: 2008-05-20 | Payer: Self-pay | Admitting: Internal Medicine

## 2008-05-20 ENCOUNTER — Ambulatory Visit: Payer: Self-pay | Admitting: Internal Medicine

## 2008-05-20 LAB — CONVERTED CEMR LAB
ALT: 14 units/L (ref 0–35)
AST: 14 units/L (ref 0–37)
Basophils Absolute: 0 10*3/uL (ref 0.0–0.1)
Basophils Relative: 0 % (ref 0–1)
CO2: 23 meq/L (ref 19–32)
Calcium: 9 mg/dL (ref 8.4–10.5)
Chloride: 106 meq/L (ref 96–112)
Creatinine, Ser: 0.75 mg/dL (ref 0.40–1.20)
HIV 1 RNA Quant: 167 copies/mL — ABNORMAL HIGH (ref <50)
Lymphocytes Relative: 45 % (ref 12–46)
MCHC: 32.8 g/dL (ref 30.0–36.0)
Neutro Abs: 2.4 10*3/uL (ref 1.7–7.7)
Neutrophils Relative %: 49 % (ref 43–77)
Platelets: 175 10*3/uL (ref 150–400)
RDW: 13.7 % (ref 11.5–15.5)
Sodium: 139 meq/L (ref 135–145)
Total Bilirubin: 0.6 mg/dL (ref 0.3–1.2)
Total Protein: 6.5 g/dL (ref 6.0–8.3)

## 2008-06-02 ENCOUNTER — Encounter: Payer: Self-pay | Admitting: Internal Medicine

## 2008-06-04 ENCOUNTER — Ambulatory Visit: Payer: Self-pay | Admitting: Internal Medicine

## 2008-06-04 DIAGNOSIS — H6690 Otitis media, unspecified, unspecified ear: Secondary | ICD-10-CM | POA: Insufficient documentation

## 2008-09-04 ENCOUNTER — Ambulatory Visit: Payer: Self-pay | Admitting: Internal Medicine

## 2008-09-04 LAB — CONVERTED CEMR LAB
AST: 15 units/L (ref 0–37)
Alkaline Phosphatase: 79 units/L (ref 39–117)
BUN: 12 mg/dL (ref 6–23)
Basophils Relative: 0 % (ref 0–1)
Creatinine, Ser: 0.77 mg/dL (ref 0.40–1.20)
Eosinophils Absolute: 0.1 10*3/uL (ref 0.0–0.7)
Eosinophils Relative: 1 % (ref 0–5)
Glucose, Bld: 133 mg/dL — ABNORMAL HIGH (ref 70–99)
HCT: 42.9 % (ref 36.0–46.0)
HIV 1 RNA Quant: 206 copies/mL — ABNORMAL HIGH (ref <50)
HIV-1 RNA Quant, Log: 2.31 — ABNORMAL HIGH (ref <1.70)
Lymphs Abs: 2.2 10*3/uL (ref 0.7–4.0)
MCHC: 32.4 g/dL (ref 30.0–36.0)
MCV: 99.3 fL (ref 78.0–100.0)
Monocytes Relative: 7 % (ref 3–12)
Neutrophils Relative %: 54 % (ref 43–77)
RBC: 4.32 M/uL (ref 3.87–5.11)
Total Bilirubin: 0.4 mg/dL (ref 0.3–1.2)
WBC: 5.7 10*3/uL (ref 4.0–10.5)

## 2008-09-19 ENCOUNTER — Ambulatory Visit: Payer: Self-pay | Admitting: Internal Medicine

## 2008-09-19 DIAGNOSIS — J209 Acute bronchitis, unspecified: Secondary | ICD-10-CM

## 2008-09-19 LAB — CONVERTED CEMR LAB: Total CK: 78 units/L (ref 7–177)

## 2008-10-06 ENCOUNTER — Telehealth: Payer: Self-pay

## 2008-10-10 ENCOUNTER — Ambulatory Visit: Payer: Self-pay | Admitting: Internal Medicine

## 2008-10-13 ENCOUNTER — Telehealth: Payer: Self-pay | Admitting: Internal Medicine

## 2008-10-14 ENCOUNTER — Telehealth: Payer: Self-pay | Admitting: Internal Medicine

## 2008-10-15 ENCOUNTER — Ambulatory Visit (HOSPITAL_COMMUNITY): Admission: RE | Admit: 2008-10-15 | Discharge: 2008-10-15 | Payer: Self-pay | Admitting: Internal Medicine

## 2008-10-15 ENCOUNTER — Encounter (INDEPENDENT_AMBULATORY_CARE_PROVIDER_SITE_OTHER): Payer: Self-pay | Admitting: Licensed Clinical Social Worker

## 2008-11-13 ENCOUNTER — Telehealth: Payer: Self-pay | Admitting: Internal Medicine

## 2008-12-23 ENCOUNTER — Telehealth: Payer: Self-pay | Admitting: Internal Medicine

## 2008-12-24 ENCOUNTER — Ambulatory Visit: Payer: Self-pay | Admitting: Infectious Diseases

## 2008-12-31 ENCOUNTER — Ambulatory Visit: Payer: Self-pay | Admitting: Internal Medicine

## 2008-12-31 LAB — CONVERTED CEMR LAB
AST: 14 units/L (ref 0–37)
Albumin: 4.9 g/dL (ref 3.5–5.2)
BUN: 19 mg/dL (ref 6–23)
Basophils Relative: 0 % (ref 0–1)
CO2: 22 meq/L (ref 19–32)
Calcium: 9.6 mg/dL (ref 8.4–10.5)
Chloride: 103 meq/L (ref 96–112)
Cholesterol: 248 mg/dL — ABNORMAL HIGH (ref 0–200)
HDL: 63 mg/dL (ref >39)
HIV 1 RNA Quant: 74 copies/mL — ABNORMAL HIGH (ref <48)
HIV-1 RNA Quant, Log: 1.87 — ABNORMAL HIGH (ref <1.68)
Hemoglobin: 15.1 g/dL — ABNORMAL HIGH (ref 12.0–15.0)
Lymphocytes Relative: 28 % (ref 12–46)
Lymphs Abs: 1.7 10*3/uL (ref 0.7–4.0)
MCHC: 33.9 g/dL (ref 30.0–36.0)
Monocytes Relative: 8 % (ref 3–12)
Neutro Abs: 4 10*3/uL (ref 1.7–7.7)
Neutrophils Relative %: 64 % (ref 43–77)
Potassium: 4.6 meq/L (ref 3.5–5.3)
RBC: 4.62 M/uL (ref 3.87–5.11)
WBC: 6.2 10*3/uL (ref 4.0–10.5)

## 2009-01-14 ENCOUNTER — Ambulatory Visit: Payer: Self-pay | Admitting: Internal Medicine

## 2009-01-14 DIAGNOSIS — R42 Dizziness and giddiness: Secondary | ICD-10-CM

## 2009-01-14 LAB — CONVERTED CEMR LAB
Chloride: 106 meq/L (ref 96–112)
Potassium: 4.3 meq/L (ref 3.5–5.3)

## 2009-04-14 ENCOUNTER — Ambulatory Visit: Payer: Self-pay | Admitting: Internal Medicine

## 2009-04-14 LAB — CONVERTED CEMR LAB
ALT: 18 units/L (ref 0–35)
Basophils Absolute: 0 10*3/uL (ref 0.0–0.1)
Basophils Relative: 0 % (ref 0–1)
CO2: 24 meq/L (ref 19–32)
Calcium: 9.1 mg/dL (ref 8.4–10.5)
Chloride: 106 meq/L (ref 96–112)
Creatinine, Ser: 0.82 mg/dL (ref 0.40–1.20)
GFR calc Af Amer: >60 mL/min (ref >60)
GFR calc non Af Amer: >60 mL/min (ref >60)
Glucose, Bld: 92 mg/dL (ref 70–99)
HIV 1 RNA Quant: 136 copies/mL — ABNORMAL HIGH (ref <48)
MCHC: 32.7 g/dL (ref 30.0–36.0)
Neutro Abs: 3.1 10*3/uL (ref 1.7–7.7)
Neutrophils Relative %: 55 % (ref 43–77)
RDW: 13.9 % (ref 11.5–15.5)
Sodium: 140 meq/L (ref 135–145)
Total Protein: 6.5 g/dL (ref 6.0–8.3)

## 2009-04-20 ENCOUNTER — Encounter: Payer: Self-pay | Admitting: Internal Medicine

## 2009-04-29 ENCOUNTER — Ambulatory Visit: Payer: Self-pay | Admitting: Internal Medicine

## 2009-05-13 ENCOUNTER — Encounter: Payer: Self-pay | Admitting: Internal Medicine

## 2009-07-28 ENCOUNTER — Ambulatory Visit: Payer: Self-pay | Admitting: Internal Medicine

## 2009-07-28 LAB — CONVERTED CEMR LAB
AST: 18 units/L (ref 0–37)
Alkaline Phosphatase: 76 units/L (ref 39–117)
BUN: 14 mg/dL (ref 6–23)
Basophils Absolute: 0 10*3/uL (ref 0.0–0.1)
Calcium: 8.7 mg/dL (ref 8.4–10.5)
Chloride: 107 meq/L (ref 96–112)
Creatinine, Ser: 0.72 mg/dL (ref 0.40–1.20)
Eosinophils Relative: 2 % (ref 0–5)
HCT: 42.9 % (ref 36.0–46.0)
HIV-1 RNA Quant, Log: 1.99 — ABNORMAL HIGH (ref <1.68)
Hemoglobin: 13.6 g/dL (ref 12.0–15.0)
Lymphocytes Relative: 44 % (ref 12–46)
Monocytes Absolute: 0.3 10*3/uL (ref 0.1–1.0)
RDW: 13.4 % (ref 11.5–15.5)
Total Bilirubin: 0.5 mg/dL (ref 0.3–1.2)

## 2009-08-06 ENCOUNTER — Encounter: Payer: Self-pay | Admitting: Internal Medicine

## 2009-08-12 ENCOUNTER — Ambulatory Visit: Payer: Self-pay | Admitting: Internal Medicine

## 2009-09-07 ENCOUNTER — Telehealth (INDEPENDENT_AMBULATORY_CARE_PROVIDER_SITE_OTHER): Payer: Self-pay | Admitting: Licensed Clinical Social Worker

## 2009-10-07 ENCOUNTER — Telehealth: Payer: Self-pay | Admitting: Internal Medicine

## 2009-10-08 ENCOUNTER — Ambulatory Visit: Payer: Self-pay | Admitting: Internal Medicine

## 2009-11-09 ENCOUNTER — Telehealth (INDEPENDENT_AMBULATORY_CARE_PROVIDER_SITE_OTHER): Payer: Self-pay | Admitting: Licensed Clinical Social Worker

## 2009-11-10 ENCOUNTER — Ambulatory Visit: Payer: Self-pay | Admitting: Internal Medicine

## 2009-11-10 LAB — CONVERTED CEMR LAB
Albumin: 4.5 g/dL (ref 3.5–5.2)
BUN: 17 mg/dL (ref 6–23)
CO2: 23 meq/L (ref 19–32)
Calcium: 9.6 mg/dL (ref 8.4–10.5)
Chloride: 102 meq/L (ref 96–112)
Eosinophils Relative: 2 % (ref 0–5)
Glucose, Bld: 112 mg/dL — ABNORMAL HIGH (ref 70–99)
HCT: 46.2 % — ABNORMAL HIGH (ref 36.0–46.0)
HIV-1 RNA Quant, Log: <1.68 (ref <1.68)
Hemoglobin: 15.3 g/dL — ABNORMAL HIGH (ref 12.0–15.0)
Lymphocytes Relative: 46 % (ref 12–46)
Lymphs Abs: 2.3 10*3/uL (ref 0.7–4.0)
Monocytes Absolute: 0.3 10*3/uL (ref 0.1–1.0)
Monocytes Relative: 7 % (ref 3–12)
Potassium: 4.3 meq/L (ref 3.5–5.3)
RDW: 12.1 % (ref 11.5–15.5)
WBC: 4.9 10*3/uL (ref 4.0–10.5)

## 2009-11-27 ENCOUNTER — Ambulatory Visit: Payer: Self-pay | Admitting: Internal Medicine

## 2009-11-27 DIAGNOSIS — M79609 Pain in unspecified limb: Secondary | ICD-10-CM

## 2009-12-08 ENCOUNTER — Telehealth: Payer: Self-pay | Admitting: Internal Medicine

## 2010-02-04 ENCOUNTER — Telehealth: Payer: Self-pay | Admitting: Internal Medicine

## 2010-03-04 ENCOUNTER — Ambulatory Visit: Payer: Self-pay | Admitting: Internal Medicine

## 2010-03-04 LAB — CONVERTED CEMR LAB
ALT: 21 units/L (ref 0–35)
AST: 16 units/L (ref 0–37)
Alkaline Phosphatase: 83 units/L (ref 39–117)
Basophils Absolute: 0 10*3/uL (ref 0.0–0.1)
Calcium: 9.1 mg/dL (ref 8.4–10.5)
Chloride: 105 meq/L (ref 96–112)
Creatinine, Ser: 0.77 mg/dL (ref 0.40–1.20)
Eosinophils Absolute: 0.1 10*3/uL (ref 0.0–0.7)
Lymphocytes Relative: 52 % — ABNORMAL HIGH (ref 12–46)
Lymphs Abs: 2.2 10*3/uL (ref 0.7–4.0)
Neutrophils Relative %: 37 % — ABNORMAL LOW (ref 43–77)
Platelets: 234 10*3/uL (ref 150–400)
WBC: 4.1 10*3/uL (ref 4.0–10.5)

## 2010-03-19 ENCOUNTER — Ambulatory Visit: Payer: Self-pay | Admitting: Internal Medicine

## 2010-03-19 DIAGNOSIS — F329 Major depressive disorder, single episode, unspecified: Secondary | ICD-10-CM | POA: Insufficient documentation

## 2010-03-19 DIAGNOSIS — F17200 Nicotine dependence, unspecified, uncomplicated: Secondary | ICD-10-CM | POA: Insufficient documentation

## 2010-04-02 ENCOUNTER — Telehealth: Payer: Self-pay | Admitting: Internal Medicine

## 2010-06-09 ENCOUNTER — Ambulatory Visit: Payer: Self-pay | Admitting: Internal Medicine

## 2010-06-09 LAB — CONVERTED CEMR LAB
Albumin: 4.4 g/dL (ref 3.5–5.2)
BUN: 10 mg/dL (ref 6–23)
CO2: 24 meq/L (ref 19–32)
Calcium: 9.3 mg/dL (ref 8.4–10.5)
Chloride: 106 meq/L (ref 96–112)
Creatinine, Ser: 0.69 mg/dL (ref 0.40–1.20)
Eosinophils Absolute: 0.1 10*3/uL (ref 0.0–0.7)
Eosinophils Relative: 2 % (ref 0–5)
Glucose, Bld: 92 mg/dL (ref 70–99)
HCT: 44 % (ref 36.0–46.0)
HIV 1 RNA Quant: <48 copies/mL (ref <48)
Hemoglobin: 14.3 g/dL (ref 12.0–15.0)
Lymphocytes Relative: 52 % — ABNORMAL HIGH (ref 12–46)
Lymphs Abs: 2.3 10*3/uL (ref 0.7–4.0)
MCV: 94.2 fL (ref 78.0–100.0)
Monocytes Absolute: 0.3 10*3/uL (ref 0.1–1.0)
Monocytes Relative: 6 % (ref 3–12)
Platelets: 179 10*3/uL (ref 150–400)
Potassium: 4.7 meq/L (ref 3.5–5.3)
RBC: 4.67 M/uL (ref 3.87–5.11)
WBC: 4.5 10*3/uL (ref 4.0–10.5)

## 2010-07-09 ENCOUNTER — Ambulatory Visit: Payer: Self-pay | Admitting: Internal Medicine

## 2010-07-09 DIAGNOSIS — I10 Essential (primary) hypertension: Secondary | ICD-10-CM

## 2010-08-05 ENCOUNTER — Telehealth: Payer: Self-pay | Admitting: Internal Medicine

## 2010-09-06 ENCOUNTER — Telehealth: Payer: Self-pay | Admitting: Infectious Diseases

## 2010-10-06 ENCOUNTER — Telehealth: Payer: Self-pay | Admitting: Internal Medicine

## 2010-10-07 ENCOUNTER — Ambulatory Visit: Payer: Self-pay | Admitting: Internal Medicine

## 2010-10-07 LAB — CONVERTED CEMR LAB
AST: 14 units/L (ref 0–37)
Albumin: 4.3 g/dL (ref 3.5–5.2)
Alkaline Phosphatase: 75 units/L (ref 39–117)
BUN: 14 mg/dL (ref 6–23)
Basophils Relative: 0 % (ref 0–1)
Calcium: 9 mg/dL (ref 8.4–10.5)
Chloride: 106 meq/L (ref 96–112)
Eosinophils Absolute: 0.1 10*3/uL (ref 0.0–0.7)
HIV 1 RNA Quant: <20 copies/mL (ref <20)
HIV-1 RNA Quant, Log: <1.3 (ref <1.30)
LDL Cholesterol: 153 mg/dL — ABNORMAL HIGH (ref 0–99)
Lymphs Abs: 2.8 10*3/uL (ref 0.7–4.0)
MCV: 97.5 fL (ref 78.0–100.0)
Monocytes Relative: 6 % (ref 3–12)
Neutro Abs: 2.3 10*3/uL (ref 1.7–7.7)
Neutrophils Relative %: 42 % — ABNORMAL LOW (ref 43–77)
Platelets: 202 10*3/uL (ref 150–400)
Potassium: 4.1 meq/L (ref 3.5–5.3)
RBC: 4.38 M/uL (ref 3.87–5.11)
Sodium: 141 meq/L (ref 135–145)
Total Protein: 6.3 g/dL (ref 6.0–8.3)
WBC: 5.5 10*3/uL (ref 4.0–10.5)

## 2010-10-21 ENCOUNTER — Encounter: Payer: Self-pay | Admitting: Internal Medicine

## 2010-10-21 ENCOUNTER — Ambulatory Visit: Payer: Self-pay | Admitting: Internal Medicine

## 2010-10-21 DIAGNOSIS — E785 Hyperlipidemia, unspecified: Secondary | ICD-10-CM

## 2010-11-15 ENCOUNTER — Encounter (INDEPENDENT_AMBULATORY_CARE_PROVIDER_SITE_OTHER): Payer: Self-pay | Admitting: *Deleted

## 2010-12-08 ENCOUNTER — Ambulatory Visit: Payer: Self-pay | Admitting: Adult Health

## 2010-12-08 DIAGNOSIS — J0100 Acute maxillary sinusitis, unspecified: Secondary | ICD-10-CM

## 2011-01-27 ENCOUNTER — Ambulatory Visit: Admit: 2011-01-27 | Payer: Self-pay | Admitting: Internal Medicine

## 2011-01-27 ENCOUNTER — Other Ambulatory Visit: Payer: Self-pay

## 2011-01-27 NOTE — Progress Notes (Signed)
Summary: Request for percocet  Phone Note Call from Patient   Caller: Patient Reason for Call: Refill Medication Summary of Call: Received a message from patient requesting a written prescription for her Percocet.  Please call patient at [phone] once it is ready to be picked up. Initial call taken by: Paulo Fruit  BS,CPht II,MPH,  August 05, 2010 2:59 PM  Follow-up for Phone Call        ok can pick up Friday or Monday Follow-up by: Yisroel Ramming MD,  August 05, 2010 3:07 PM    Prescriptions: PERCOCET 10-325 MG TABS (OXYCODONE-ACETAMINOPHEN) Take 1 tablet by mouth every 6 hours as needed (Do not fill until 06/07/10)  #120 x 0   Entered by:   Wendall Mola CMA ( AAMA)   Authorized by:   Yisroel Ramming MD   Signed by:   Wendall Mola CMA ( AAMA) on 08/06/2010   Method used:   Print then Give to Patient   RxID:   [id number]

## 2011-01-27 NOTE — Assessment & Plan Note (Signed)
Summary: cold? [mkj]   CC:  pt. c/o fevers, sinus and head congestion, left ear clogged, sorethroat, and cough x 1 week.  History of Present Illness: 3-day onset head congestion, cough, generalized malaise and nonexertional fatigue.  Some SOB but no DOE.  Denies fever, chills, sweats, or pleuriic chest pain.  Does have c/o fullness in ears.  Preventive Screening-Counseling & Management  Alcohol-Tobacco     Alcohol drinks/day: 0     Alcohol type: beer, occassionally     Smoking Status: current     Smoking Cessation Counseling: yes     Packs/Day: 1.0     Passive Smoke Exposure: yes  Caffeine-Diet-Exercise     Caffeine use/day: 1     Does Patient Exercise: no  Hep-HIV-STD-Contraception     HIV Risk: no     HIV Risk Counseling: 10/11/2006  Safety-Violence-Falls     Seat Belt Use: 100      Sexual History:  currently monogamous.        Drug Use:  never.        Blood Transfusions:  no.        Travel History:  none.    Comments: pt. declined condoms   Current Allergies (reviewed today): No known allergies  Past History:  Past medical, surgical, family and social histories (including risk factors) reviewed for relevance to current acute and chronic problems. Social history (including risk factors) reviewed for relevance to current acute and chronic problems.  Past Medical History: Reviewed history from 10/27/2006 and no changes required. 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 Hysterectomy Oophorectomy  Family History: Reviewed history and no changes required.  Social History: Reviewed history and no changes required.  Review  of Systems General:  See HPI; Complains of fatigue and malaise; denies chills, fever, loss of appetite, sleep disorder, sweats, weakness, and weight loss. Eyes:  Complains of discharge and eye irritation; denies blurring, double vision, eye pain, halos, itching, light sensitivity, red eye, vision loss-1 eye, and vision loss-both eyes; Waking up in a.m. with eyes crusted.. ENT:  Complains of earache, nasal congestion, postnasal drainage, ringing in ears, sinus pressure, and sore throat; denies decreased hearing, difficulty swallowing, ear discharge, hoarseness, and nosebleeds. CV:  Denies bluish discoloration of lips or nails, chest pain or discomfort, difficulty breathing at night, difficulty breathing while lying down, fainting, fatigue, leg cramps with exertion, lightheadness, near fainting, palpitations, shortness of breath with exertion, swelling of feet, swelling of hands, and weight gain. Resp:  Complains of cough, shortness of breath, and sputum productive; denies chest discomfort, chest pain with inspiration, coughing up blood, excessive snoring, hypersomnolence, morning headaches, pleuritic, and wheezing. GI:  Denies abdominal pain, bloody stools, change in bowel habits, constipation, dark tarry stools, diarrhea, excessive appetite, gas, hemorrhoids, indigestion, loss of appetite, nausea, vomiting, vomiting blood, and yellowish skin color. GU:  Denies abnormal vaginal bleeding, decreased libido, discharge, dysuria, genital sores, hematuria, incontinence, nocturia, urinary frequency, and urinary hesitancy. MS:  Denies joint pain, joint redness, joint swelling, loss of strength, low back pain, mid back pain, muscle aches, muscle , cramps, muscle weakness, stiffness, and thoracic pain; Denies arthralgias or myalgias. Derm:  Denies changes in color of skin, changes in nail beds, dryness,  excessive perspiration, flushing, hair loss, insect bite(s), itching, lesion(s), poor wound healing, and  rash. Neuro:  Denies brief paralysis, difficulty with concentration, disturbances in coordination, falling down, headaches, inability to speak, memory loss, numbness, poor balance, seizures, sensation of room spinning, tingling, tremors, visual disturbances, and weakness.  Vital Signs:  Patient profile:   55 year old female Menstrual status:  postmenopausal Height:      65 inches (165.10 cm) Weight:      138.8 pounds (63.09 kg) BMI:     23.18 O2 Sat:      98 % on Room air Temp:     98.8 degrees F (37.11 degrees C) oral Pulse rate:   64 / minute BP sitting:   112 / 68  (right arm)  Vitals Entered By: Wendall Mola CMA Duncan Dull) (December 08, 2010 2:43 PM)  O2 Flow:  Room air CC: pt. c/o fevers, sinus and head congestion, left ear clogged, sorethroat, cough x 1 week Is Patient Diabetic? No Pain Assessment Patient in pain? no      Nutritional Status BMI of 19 -24 = normal Nutritional Status Detail appetite "ok"  Have you ever been in a relationship where you felt threatened, hurt or afraid?No   Does patient need assistance? Functional Status Self care Ambulation Normal Comments no missed doses of meds per pt.   Physical Exam  General:  alert, well-developed, well-nourished, well-hydrated, appropriate dress, cooperative to examination, good hygiene, and uncomfortable-appearing.   Head:  Normocephalic and atraumatic without obvious abnormalities. No apparent alopecia or balding. Eyes:  vision grossly intact, pupils equal, pupils round, pupils reactive to light, and pupils react to accomodation.  Excessive tearing OU with slight injection noted. Ears:  R TM erythema, R TM bulging, L TM erythema, and L TM bulging.  Air-fluid level present bilateral TM's Nose:  external erythema, nasal dischargemucosal pallor, mucosal erythema, mucosal edema, L maxillary sinus tenderness, and R maxillary sinus tenderness.   Mouth:  fair dentition and postnasal drip.  Pharynx slightly  injected. Neck:  No deformities, masses, or tenderness noted. Chest Wall:  No deformities, masses, or tenderness noted. Lungs:  Bronchiolar sounds heard in all lung fields. Heart:  Normal rate and regular rhythm. S1 and S2 normal without gallop, murmur, click, rub or other extra sounds. Msk:  No deformity or scoliosis noted of thoracic or lumbar spine.   Pulses:  R and L carotid,radial,femoral,dorsalis pedis and posterior tibial pulses are full and equal bilaterally Extremities:  No clubbing, cyanosis, edema, or deformity noted with normal full range of motion of all joints.   Cervical Nodes:  (+) A&P LAD soft, mobile, nontneder Axillary Nodes:  No palpable lymphadenopathy Psych:  Cognition and judgment appear intact. Alert and cooperative with normal attention span and concentration. No apparent delusions, illusions, hallucinations   Impression & Recommendations:  Problem # 1:  ACUTE BRONCHITIS (ICD-466.0) Most likely complicated by post nasal drip.  Will try Avelox 400mg  once daily x 10 days, OTC Robitussin DM or Mucinex DM per package instructions for cough, force fluids, stop smoking, ibuprofen 600mg  q4-6h for pain or relief of any fever, bed rest for now, humidification. Her updated medication list for this problem includes:    Avelox 400 Mg Tabs (Moxifloxacin hcl) .Marland Kitchen... 1  tablet by mouth once daily for 10 days  Problem # 2:  ACUTE MAXILLARY SINUSITIS (ICD-461.0) In addition to plan as outlined in #1, we will suggest OTC zyrtec 10 mg once daily or Benadryl 25mg  q6h to relieve pressure. Her  updated medication list for this problem includes:    Avelox 400 Mg Tabs (Moxifloxacin hcl) .Marland Kitchen... 1  tablet by mouth once daily for 10 days  Problem # 3:  UNSPECIFIED OTITIS MEDIA (ICD-382.9) While no exudate found, there is significant air-fluid level to both TM's with erythema to L TM.  Plan will include both those in #1 & #2.  She is instructed to contact clinic if symptoms worsen or do not  improve over the 7-10 days. Her updated medication list for this problem includes:    Avelox 400 Mg Tabs (Moxifloxacin hcl) .Marland Kitchen... 1  tablet by mouth once daily for 10 days  Medications Added to Medication List This Visit: 1)  Avelox 400 Mg Tabs (Moxifloxacin hcl) .Marland Kitchen.. 1  tablet by mouth once daily for 10 days  Patient Instructions: 1)  Recommend increasing fluid intake for hydration for the next few days. 2)  Take 400-600mg  of Ibuprofen (Advil, Motrin) every 4-6 hours as needed for relief of pain or comfort of fever. 3)  May take Robitussin DM or Mucinex DM or their equivalent as per package directions. 4)  Try OTC Benadryl 25 mg or OTC Zyrtec 10mg  per package instructioins for head congestion. 5)  Bed rest. 6)  If symptoms worsen or do not improve over the next 7-10 days please contact clinic. Prescriptions: AVELOX 400 MG TABS (MOXIFLOXACIN HCL) 1  tablet by mouth once daily for 10 days  #10 x 0   Entered and Authorized by:   Talmadge Chad NP   Signed by:   Talmadge Chad NP on 12/08/2010   Method used:   Print then Give to Patient   RxID:   [id number]

## 2011-01-27 NOTE — Assessment & Plan Note (Signed)
Summary: F/U/VS   CC:  wants prozac dosage increased and wants rx for chantix.  History of Present Illness: Pt doing well. She is going to [place] in April for 2 months to be with her daughter who is having a baby.  It is her first [place] so she is excited. She would like to start chantix so she can stop smoking prior to her trip. She will need 2 months of her meds prior to going. No missed doses of her HIV meds.  Preventive Screening-Counseling & Management  Alcohol-Tobacco     Alcohol drinks/day: 0     Alcohol type: beer, occassionally     Smoking Status: current     Smoking Cessation Counseling: yes     Packs/Day: 0.25     Passive Smoke Exposure: yes  Caffeine-Diet-Exercise     Caffeine use/day: 1     Does Patient Exercise: no   Updated Prior Medication List: PROPRANOLOL HCL 40 MG TABS (PROPRANOLOL HCL) Take 1 tablet by mouth two times a day KALETRA 200-50 MG TABS (LOPINAVIR-RITONAVIR) Take 2 tablets by mouth two times a day TRUVADA 200-300 MG TABS (EMTRICITABINE-TENOFOVIR) Take 1 tablet by mouth once a day VIVELLE-DOT 0.05 MG/24HR PTTW (ESTRADIOL) Apply to skin 2 times a week PERCOCET 10-325 MG TABS (OXYCODONE-ACETAMINOPHEN) Take 1 tablet by mouth every 6 hours as needed PROZAC 40 MG CAPS (FLUOXETINE HCL) Take 1 tablet by mouth once a day CHANTIX STARTING MONTH PAK 0.5 MG X 11 & 1 MG X 42 TABS (VARENICLINE TARTRATE) take as directed CHANTIX CONTINUING MONTH PAK 1 MG TABS (VARENICLINE TARTRATE) take as directed  Current Allergies (reviewed today): No known allergies  Past History:  Past Medical History: Last updated: 10/27/2006 Headache - migraine HIV disease dysuria - 12/1995 resolved gastritis - 07/1996 cervical spine degenerative disc disease - 12/1998 & 05/2003 lumbar spine degenerative disc disease - 05/2002 dysuria with hematuria  -  query hemorrhagic cystitis - 12/2003 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    Vital Signs:  Patient profile:   55 year old female Menstrual status:  postmenopausal Height:      65 inches (165.10 cm) Weight:      143 pounds (65 kg) BMI:     23.88 Temp:     97.1 degrees F (36.17 degrees C) oral Pulse rate:   69 / minute BP sitting:   153 / 79  (left arm)  Vitals Entered By: Starleen Arms CMA (March 19, 2010 9:55 AM) CC: wants prozac dosage increased, wants rx for chantix Is Patient Diabetic? No Pain Assessment Patient in pain? no      Nutritional Status BMI of 19 -24 = normal Nutritional Status Detail nl  Does patient need assistance? Functional Status Self care Ambulation Normal   Physical Exam  General:  alert, well-developed, well-nourished, and well-hydrated.   Head:  normocephalic and atraumatic.   Lungs:  normal breath sounds.      Impression & Recommendations:  Problem # 1:  HIV DISEASE (ICD-042) Pt.s most recent CD4ct was 600 and VL <48 .  Pt instructed to continue the current antiretroviral regimen.  Pt encouraged to take medication regularly and not miss doses.  Pt will f/u in 3 months for repeat blood work and will see me 2 weeks later.  Diagnostics Reviewed:  CD4: 600 (03/05/2010)   WBC: 4.1 (03/04/2010)   Hgb: 13.3 (03/04/2010)   HCT: 41.4 (03/04/2010)   Platelets: 234 (  03/04/2010) HIV-1 RNA: <48 copies/mL (03/04/2010)   HBSAg: No (02/19/2007)  Problem # 2:  DEPRESSION, MILD (ICD-311) will increase her prozac per her request The following medications were removed from the medication list:    Prozac 20 Mg Caps (Fluoxetine hcl) .Marland Kitchen... Take 1 capsule by mouth once a day Her updated medication list for this problem includes:    Prozac 40 Mg Caps (Fluoxetine hcl) .Marland Kitchen... Take 1 tablet by mouth once a day  Problem # 3:  ARM PAIN (ICD-729.5) continue her percocet  Problem # 4:  TOBACCO USER (ICD-305.1) will give her an Rx for chantix. Potential side effects were discussed Her updated  medication list for this problem includes:    Chantix Starting Month Pak 0.5 Mg X 11 & 1 Mg X 42 Tabs (Varenicline tartrate) .Marland Kitchen... Take as directed    Chantix Continuing Month Pak 1 Mg Tabs (Varenicline tartrate) .Marland Kitchen... Take as directed  Medications Added to Medication List This Visit: 1)  Prozac 40 Mg Caps (Fluoxetine hcl) .... Take 1 tablet by mouth once a day 2)  Chantix Starting Month Pak 0.5 Mg X 11 & 1 Mg X 42 Tabs (Varenicline tartrate) .... Take as directed 3)  Chantix Continuing Month Pak 1 Mg Tabs (Varenicline tartrate) .... Take as directed  Other Orders: Future Orders: T-CD4SP ([facility]) (CD4SP) ... 09/15/2010 T-HIV Viral Load [phone]) ... 09/15/2010 T-Comprehensive Metabolic Panel [phone]) ... 09/15/2010 T-CBC w/Diff ([id number]) ... 09/15/2010 T-Lipid Profile [id number]) ... 09/15/2010  Patient Instructions: 1)  Please schedule a follow-up appointment in 6 months, 2weeks after labs.  Prescriptions: CHANTIX CONTINUING MONTH PAK 1 MG TABS (VARENICLINE TARTRATE) take as directed  #1 pack x 0   Entered and Authorized by:   Yisroel Ramming MD   Signed by:   Yisroel Ramming MD on 03/19/2010   Method used:   Print then Give to Patient   RxID:   [id number] CHANTIX STARTING MONTH PAK 0.5 MG X 11 & 1 MG X 42 TABS (VARENICLINE TARTRATE) take as directed  #1 pack x 0   Entered and Authorized by:   Yisroel Ramming MD   Signed by:   Yisroel Ramming MD on 03/19/2010   Method used:   Print then Give to Patient   RxID:   [id number] PROZAC 40 MG CAPS (FLUOXETINE HCL) Take 1 tablet by mouth once a day  #30 x 5   Entered and Authorized by:   Yisroel Ramming MD   Signed by:   Yisroel Ramming MD on 03/19/2010   Method used:   Print then Give to Patient   RxID:   [id number] TRUVADA 200-300 MG TABS (EMTRICITABINE-TENOFOVIR) Take 1 tablet by mouth once a day  #30 x 6   Entered and Authorized by:   Yisroel Ramming MD   Signed by:   Yisroel Ramming MD on 03/19/2010   Method  used:   Print then Give to Patient   RxID:   [id number] KALETRA 200-50 MG TABS (LOPINAVIR-RITONAVIR) Take 2 tablets by mouth two times a day  #120 x 6   Entered and Authorized by:   Yisroel Ramming MD   Signed by:   Yisroel Ramming MD on 03/19/2010   Method used:   Print then Give to Patient   RxID:   [id number]  Process Orders Check Orders Results:     [facility]: Order checked:     22930 -- T-Lipid Profile -- ABN required due to diagnosis (CPT: 29937) Tests Sent for requisitioning (March 19, 2010 4:25  PM):     09/15/2010: [facility] -- T-HIV Viral Load [id number] (signed)     09/15/2010: [facility] -- T-Comprehensive Metabolic Panel [[id number]] (signed)     09/15/2010: [facility] -- T-CBC w/Diff [[id number]] (signed)     09/15/2010: [facility] -- T-Lipid Profile [id number] (signed)

## 2011-01-27 NOTE — Assessment & Plan Note (Signed)
Summary: PAPER WORK FOR  DISABILITY TO FILOUT [MKJ]   CC:  pt. here to discuss disability forms.  History of Present Illness: Pt returned from [place] where she was with her daughter who gave birth toher grandchild. She has no new complaints.  Pt here to get disability paperwork filled out.  Depression History:      The patient denies a depressed mood most of the day and a diminished interest in her usual daily activities.        The patient denies that she feels like life is not worth living, denies that she wishes that she were dead, and denies that she has thought about ending her life.        Preventive Screening-Counseling & Management  Alcohol-Tobacco     Alcohol drinks/day: 0     Alcohol type: beer, occassionally     Smoking Status: current     Smoking Cessation Counseling: yes     Packs/Day: 0.25     Passive Smoke Exposure: yes  Caffeine-Diet-Exercise     Caffeine use/day: 1     Does Patient Exercise: no  Hep-HIV-STD-Contraception     HIV Risk: no     HIV Risk Counseling: 10/11/2006  Safety-Violence-Falls     Seat Belt Use: 100      Sexual History:  currently monogamous.        Drug Use:  never.        Blood Transfusions:  no.        Travel History:  none.    Comments: pt. declined condoms   Updated Prior Medication List: PROPRANOLOL HCL 40 MG TABS (PROPRANOLOL HCL) Take 1 tablet by mouth two times a day KALETRA 200-50 MG TABS (LOPINAVIR-RITONAVIR) Take 2 tablets by mouth two times a day TRUVADA 200-300 MG TABS (EMTRICITABINE-TENOFOVIR) Take 1 tablet by mouth once a day VIVELLE-DOT 0.05 MG/24HR PTTW (ESTRADIOL) Apply to skin 2 times a week PERCOCET 10-325 MG TABS (OXYCODONE-ACETAMINOPHEN) Take 1 tablet by mouth every 6 hours as needed (Do not fill until 06/07/10) PROZAC 40 MG CAPS (FLUOXETINE HCL) Take 1 tablet by mouth once a day  Current Allergies (reviewed today): No known allergies  Past History:  Past Medical History: Last updated:  10/27/2006 Headache - migraine HIV disease dysuria - 12/1995 resolved gastritis - 07/1996 cervical spine degenerative disc disease - 12/1998 & 05/2003 lumbar spine degenerative disc disease - 05/2002 dysuria with hematuria  -  query hemorrhagic cystitis - 12/2003 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    Vital Signs:  Patient profile:   55 year old female Menstrual status:  postmenopausal Height:      65 inches (165.10 cm) Weight:      142.4 pounds (64.73 kg) BMI:     23.78 Temp:     97.5 degrees F (36.39 degrees C) oral Pulse rate:   58 / minute BP sitting:   146 / 83  (right arm)  Vitals Entered By: Wendall Mola CMA Duncan Dull) (June 09, 2010 9:27 AM) CC: pt. here to discuss disability forms Is Patient Diabetic? No Pain Assessment Patient in pain? no      Nutritional Status BMI of 19 -24 = normal Nutritional Status Detail appetite "good"  Have you ever been in a relationship where you felt threatened, hurt or afraid?No   Does patient need assistance? Functional Status Self care Ambulation Normal Comments no missed doses  of meds per patient   Physical Exam  General:  alert, well-developed, well-nourished, and well-hydrated.   Head:  normocephalic and atraumatic.   Lungs:  normal breath sounds.     Impression & Recommendations:  Problem # 1:  HIV DISEASE (ICD-042) Will obtain labs today.  Pt to continue current meds. Orders: Est. Patient Level III (37106) T-CBC w/Diff [phone]) T-CD4SP ([facility]) (CD4SP) T-Comprehensive Metabolic Panel [phone]) T-HIV Viral Load [phone])  Problem # 2:  DEGENERATIVE DISC DISEASE, CERVICAL SPINE (ICD-722.4) continue percocet

## 2011-01-27 NOTE — Progress Notes (Signed)
Summary: Pt. request Percocet  Phone Note Call from Patient   Caller: Patient Reason for Call: Refill Medication Summary of Call: Patient  called requesting a refill on her Percocet.  Please call patient was ready to pick up written Rx.  Her number [phone]. Initial call taken by: Paulo Fruit  BS,CPht II,MPH,  September 06, 2010 8:39 AM    Prescriptions: PERCOCET 10-325 MG TABS (OXYCODONE-ACETAMINOPHEN) Take 1 tablet by mouth every 6 hours as needed (Do not fill until 06/07/10)  #120 x 0   Entered by:   Wendall Mola CMA ( AAMA)   Authorized by:   Johny Sax MD   Signed by:   Wendall Mola CMA ( AAMA) on 09/06/2010   Method used:   Print then Give to Patient   RxID:   [id number]   Appended Document: No Show/Late Cancellation    Nurse Visit    Allergies: No Known Drug Allergies Prescriptions: PERCOCET 10-325 MG TABS (OXYCODONE-ACETAMINOPHEN) Take 1 tablet by mouth every 6 hours as needed for pain  #120 x 0   Entered and Authorized by:   Johny Sax MD   Signed by:   Johny Sax MD on 09/06/2010   Method used:   Print then Give to Patient   RxID:   [id number]

## 2011-01-27 NOTE — Assessment & Plan Note (Signed)
Summary: Dawn Byrd from 6/29 2wk f/u [mkj]   CC:  follow-up visit and lab results.  History of Present Illness: Pt here for f/u.  She has not been taking the propranololbut has been taking her HIV meds. She did have a HA yesterday. No CP.  Depression History:      The patient denies a depressed mood most of the day and a diminished interest in her usual daily activities.        The patient denies that she feels like life is not worth living, denies that she wishes that she were dead, and denies that she has thought about ending her life.        Preventive Screening-Counseling & Management  Alcohol-Tobacco     Alcohol drinks/day: 0     Alcohol type: beer, occassionally     Smoking Status: current     Smoking Cessation Counseling: yes     Packs/Day: 0.25     Passive Smoke Exposure: yes  Caffeine-Diet-Exercise     Caffeine use/day: 1     Does Patient Exercise: no  Hep-HIV-STD-Contraception     HIV Risk: no     HIV Risk Counseling: 10/11/2006  Safety-Violence-Falls     Seat Belt Use: 100      Sexual History:  currently monogamous.        Drug Use:  never.        Blood Transfusions:  no.        Travel History:  none.    Comments: pt. declined condoms   Updated Prior Medication List: KALETRA 200-50 MG TABS (LOPINAVIR-RITONAVIR) Take 2 tablets by mouth two times a day TRUVADA 200-300 MG TABS (EMTRICITABINE-TENOFOVIR) Take 1 tablet by mouth once a day VIVELLE-DOT 0.05 MG/24HR PTTW (ESTRADIOL) Apply to skin 2 times a week PERCOCET 10-325 MG TABS (OXYCODONE-ACETAMINOPHEN) Take 1 tablet by mouth every 6 hours as needed (Do not fill until 06/07/10) PROZAC 40 MG CAPS (FLUOXETINE HCL) Take 1 tablet by mouth once a day ATENOLOL 50 MG TABS (ATENOLOL) Take 1 tablet by mouth once a day  Current Allergies (reviewed today): No known allergies  Past History:  Past Medical History: Last updated: 10/27/2006 Headache - migraine HIV disease dysuria - 12/1995 resolved gastritis -  07/1996 cervical spine degenerative disc disease - 12/1998 & 05/2003 lumbar spine degenerative disc disease - 05/2002 dysuria with hematuria  -  query hemorrhagic cystitis - 12/2003 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    Vital Signs:  Patient profile:   55 year old female Menstrual status:  postmenopausal Height:      65 inches (165.10 cm) Weight:      139.0 pounds (63.18 kg) BMI:     23.21 Temp:     98.4 degrees F (36.89 degrees C) oral Pulse rate:   69 / minute BP sitting:   154 / 79  (right arm)  Vitals Entered By: Wendall Mola CMA ( AAMA) (July 09, 2010 9:22 AM) CC: follow-up visit, lab results Is Patient Diabetic? No Pain Assessment Patient in pain? yes     Location: left arm Intensity: 5 Type: aching Onset of pain  Constant Nutritional Status BMI of 19 -24 = normal Nutritional Status Detail appetite "good"  Have you ever been in a relationship where you felt threatened, hurt or afraid?No   Does patient need assistance? Functional Status Self care Ambulation Normal Comments no missed doses of meds per patient   Physical Exam  General:  alert, well-developed, well-nourished, and well-hydrated.   Head:  normocephalic and atraumatic.   Mouth:  pharynx pink and moist.   Lungs:  normal breath sounds.          Medication Adherence: 07/09/2010   Adherence to medications reviewed with patient. Counseling to provide adequate adherence provided                                Impression & Recommendations:  Problem # 1:  HIV DISEASE (ICD-042) Pt.s most recent CD4ct was 680 and VL ,48 .  Pt instructed to continue the current antiretroviral regimen.  Pt encouraged to take medication regularly and not miss doses.  Pt will f/u in 3 months for repeat blood work and will see me 2 weeks later.  Diagnostics Reviewed:  CD4: 680 (06/10/2010)   WBC: 4.5 (06/09/2010)   Hgb: 14.3  (06/09/2010)   HCT: 44.0 (06/09/2010)   Platelets: 179 (06/09/2010) HIV-1 RNA: <48 copies/mL (06/09/2010)   HBSAg: No (02/19/2007)  Problem # 2:  ESSENTIAL HYPERTENSION, BENIGN (ICD-401.1) will start atenolol re-check BP next visit The following medications were removed from the medication list:    Propranolol Hcl 40 Mg Tabs (Propranolol hcl) .Marland Kitchen... Take 1 tablet by mouth two times a day Her updated medication list for this problem includes:    Atenolol 50 Mg Tabs (Atenolol) .Marland Kitchen... Take 1 tablet by mouth once a day  Medications Added to Medication List This Visit: 1)  Atenolol 50 Mg Tabs (Atenolol) .... Take 1 tablet by mouth once a day  Other Orders: Est. Patient Level III (16109) Future Orders: T-CD4SP ([facility]) (CD4SP) ... 10/07/2010 T-HIV Viral Load [phone]) ... 10/07/2010 T-Comprehensive Metabolic Panel [phone]) ... 10/07/2010 T-CBC w/Diff ([id number]) ... 10/07/2010  Patient Instructions: 1)  Please schedule a follow-up appointment in 3 months, 2 weeks after labs.  Prescriptions: ATENOLOL 50 MG TABS (ATENOLOL) Take 1 tablet by mouth once a day  #30 x 5   Entered and Authorized by:   Yisroel Ramming MD   Signed by:   Yisroel Ramming MD on 07/09/2010   Method used:   Print then Give to Patient   RxID:   [id number] PERCOCET 10-325 MG TABS (OXYCODONE-ACETAMINOPHEN) Take 1 tablet by mouth every 6 hours as needed (Do not fill until 06/07/10)  #120 x 0   Entered and Authorized by:   Yisroel Ramming MD   Signed by:   Yisroel Ramming MD on 07/09/2010   Method used:   Print then Give to Patient   RxID:   [id number]

## 2011-01-27 NOTE — Assessment & Plan Note (Signed)
Summary: Office Visit - Infectious Disease   CC:  follow-up visit, lab results, and discuss changing Prozaac.  History of Present Illness: Pt thinks that her prozac is no longer working.  She c/o obsessive thinking. No missed doses of her hIV meds.  Depression History:      The patient denies a depressed mood most of the day and a diminished interest in her usual daily activities.        The patient denies that she feels like life is not worth living, denies that she wishes that she were dead, and denies that she has thought about ending her life.        Preventive Screening-Counseling & Management  Alcohol-Tobacco     Alcohol drinks/day: 0     Alcohol type: beer, occassionally     Smoking Status: current     Smoking Cessation Counseling: yes     Packs/Day: 1.0     Passive Smoke Exposure: yes  Caffeine-Diet-Exercise     Caffeine use/day: 1     Does Patient Exercise: no  Hep-HIV-STD-Contraception     HIV Risk: no     HIV Risk Counseling: 10/11/2006  Safety-Violence-Falls     Seat Belt Use: 100      Sexual History:  currently monogamous.        Drug Use:  never.        Blood Transfusions:  no.        Travel History:  none.    Comments: pt. declined condoms   Updated Prior Medication List: KALETRA 200-50 MG TABS (LOPINAVIR-RITONAVIR) Take 2 tablets by mouth two times a day TRUVADA 200-300 MG TABS (EMTRICITABINE-TENOFOVIR) Take 1 tablet by mouth once a day PERCOCET 10-325 MG TABS (OXYCODONE-ACETAMINOPHEN) Take 1 tablet by mouth every 6 hours as needed for pain ATENOLOL 50 MG TABS (ATENOLOL) Take 1 tablet by mouth once a day CELEXA 20 MG TABS (CITALOPRAM HYDROBROMIDE) Take 1 tablet by mouth once a day  Current Allergies (reviewed today): No known allergies  Past History:  Past Medical History: Last updated: 10/27/2006 Headache - migraine HIV disease dysuria - 12/1995 resolved gastritis - 07/1996 cervical spine degenerative disc disease - 12/1998 & 05/2003 lumbar  spine degenerative disc disease - 05/2002 dysuria with hematuria  -  query hemorrhagic cystitis - 12/2003 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    Vital Signs:  Patient profile:   55 year old female Menstrual status:  postmenopausal Height:      65 inches (165.10 cm) Weight:      138.2 pounds (62.82 kg) BMI:     23.08 Temp:     97.6 degrees F (36.44 degrees C) oral Pulse rate:   60 / minute BP sitting:   142 / 82  (right arm)  Vitals Entered By: Wendall Mola CMA Duncan Dull) (October 21, 2010 8:54 AM) CC: follow-up visit, lab results, discuss changing Prozaac Is Patient Diabetic? No Pain Assessment Patient in pain? no      Nutritional Status BMI of 19 -24 = normal Nutritional Status Detail appetite "good"  Have you ever been in a relationship where you felt threatened, hurt or afraid?No   Does patient need assistance? Functional Status Self care Ambulation Normal Comments no missed doses of meds per pt.   Physical Exam  General:  alert, well-developed, well-nourished, and well-hydrated.   Head:  normocephalic and atraumatic.   Mouth:  pharynx pink and moist.   Lungs:  normal breath sounds.      Impression & Recommendations:  Problem # 1:  HIV DISEASE (ICD-042) Pt.s most recent CD4ct was 770 and VL  <20.  Pt instructed to continue the current antiretroviral regimen.  Pt encouraged to take medication regularly and not miss doses.  Pt will f/u in 3 months for repeat blood work and will see me 2 weeks later.  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 Take 1 tablet by mouth once a day Her updated medication list for this problem includes:    Celexa 20 Mg Tabs (Citalopram hydrobromide) .Marland Kitchen... Take 1 tablet by mouth once a day  Problem # 3:  ESSENTIAL HYPERTENSION, BENIGN (ICD-401.1) discussed eliminating salt exercise Her updated medication list for this problem includes:    Atenolol 50 Mg Tabs (Atenolol) .Marland Kitchen... Take 1 tablet by mouth once a day  Problem # 4:  HYPERLIPIDEMIA (ICD-272.4) discussed diet and exercise  Medications Added to Medication List This Visit: 1)  Celexa 20 Mg Tabs (Citalopram hydrobromide) .... Take 1 tablet by mouth once a day  Other Orders: Est. Patient Level III (16109) Future Orders: T-CD4SP ([facility]) (CD4SP) ... 01/19/2011 T-HIV Viral Load [phone]) ... 01/19/2011 T-Comprehensive Metabolic Panel [id number]) ... 01/19/2011 T-CBC w/Diff ([id number]) ... 01/19/2011 T-Lipid Profile [phone]) ... 01/19/2011  Patient Instructions: 1)  Please schedule a follow-up appointment in 3 months, 2 weeks after labs.  Prescriptions: CELEXA 20 MG TABS (CITALOPRAM HYDROBROMIDE) Take 1 tablet by mouth once a day  #30 x 5   Entered and Authorized by:   Yisroel Ramming MD   Signed by:   Yisroel Ramming MD on 10/21/2010   Method used:   Print then Give to Patient   RxID:   [id number]

## 2011-01-27 NOTE — Progress Notes (Signed)
Summary: Request Percocet prescriptions  Phone Note Call from Patient Call back at Home Phone [phone]   Caller: Patient Reason for Call: Refill Medication Details for Reason: Request Percocet prescriptions Summary of Call: Patient called to request a refill on her Percocet.  Patient states that she will be out-of-town for a couple of months and would like to have additional prescriptions written so she can get them filled where she is visiting.  Patient states that she spoke with Dr. Philipp Deputy in reference to this and it was agreed that the prescriptions will be post dated.  She needs one for April 07, 2010, (Post date for May 13 and June 13).  Please call patient once these are written so she can come and pick up.   Initial call taken by: Paulo Fruit  BS,CPht II,MPH,  April 02, 2010 10:13 AM  Follow-up for Phone Call        ok ready for pick-up Follow-up by: Yisroel Ramming MD,  April 02, 2010 2:26 PM  Additional Follow-up for Phone Call Additional follow up Details #1::       Additional Follow-up by: Wendall Mola CMA Duncan Dull),  April 06, 2010 9:46 AM    New/Updated Medications: PERCOCET 10-325 MG TABS (OXYCODONE-ACETAMINOPHEN) Take 1 tablet by mouth every 6 hours as needed (Do not fill until 05/07/10) PERCOCET 10-325 MG TABS (OXYCODONE-ACETAMINOPHEN) Take 1 tablet by mouth every 6 hours as needed (Do not fill until 06/07/10) Prescriptions: PERCOCET 10-325 MG TABS (OXYCODONE-ACETAMINOPHEN) Take 1 tablet by mouth every 6 hours as needed (Do not fill until 06/07/10)  #120 x 0   Entered and Authorized by:   Yisroel Ramming MD   Signed by:   Yisroel Ramming MD on 04/02/2010   Method used:   Print then Give to Patient   RxID:   [id number] PERCOCET 10-325 MG TABS (OXYCODONE-ACETAMINOPHEN) Take 1 tablet by mouth every 6 hours as needed (Do not fill until 05/07/10)  #120 x 0   Entered and Authorized by:   Yisroel Ramming MD   Signed by:   Yisroel Ramming MD on 04/02/2010   Method used:    Print then Give to Patient   RxID:   [id number] PERCOCET 10-325 MG TABS (OXYCODONE-ACETAMINOPHEN) Take 1 tablet by mouth every 6 hours as needed  #120 x 0   Entered and Authorized by:   Yisroel Ramming MD   Signed by:   Yisroel Ramming MD on 04/02/2010   Method used:   Print then Give to Patient   RxID:   [id number]

## 2011-01-27 NOTE — Miscellaneous (Signed)
  Clinical Lists Changes  Observations: Added new observation of YEARAIDSPOS: 1992  (11/15/2010 13:43)

## 2011-01-27 NOTE — Progress Notes (Signed)
Summary: Prescription - Percocet  Phone Note Call from Patient   Summary of Call: Pt needs her percocet refilled.  Patient is coming to lab in AM if it can be ready then it would be a help.  Please advise patient of when RX is ready.  Thanks. Initial call taken by: Altamease Oiler,  October 06, 2010 9:02 AM    Prescriptions: PERCOCET 10-325 MG TABS (OXYCODONE-ACETAMINOPHEN) Take 1 tablet by mouth every 6 hours as needed for pain  #120 x 0   Entered by:   Wendall Mola CMA ( AAMA)   Authorized by:   Yisroel Ramming MD   Signed by:   Wendall Mola CMA ( AAMA) on 10/06/2010   Method used:   Print then Give to Patient   RxID:   [id number]

## 2011-01-27 NOTE — Progress Notes (Signed)
Summary: Request for Pain med  Phone Note Call from Patient Call back at Home Phone [phone]   Caller: Patient Reason for Call: Refill Medication Summary of Call: Patient called requesting a refill prescription for her percocet.  Please call her at [phone] once its ready to be picked up. Initial call taken by: Paulo Fruit  BS,CPht II,MPH,  February 04, 2010 9:00 AM  Follow-up for Phone Call        ok x 1 Follow-up by: Yisroel Ramming MD,  February 04, 2010 9:16 AM    Prescriptions: PERCOCET 10-325 MG TABS (OXYCODONE-ACETAMINOPHEN) Take 1 tablet by mouth every 6 hours as needed  #120 x 0   Entered by:   Tomasita Morrow RN   Authorized by:   Yisroel Ramming MD   Signed by:   Tomasita Morrow RN on 02/05/2010   Method used:   Print then Give to Patient   RxID:   [id number]

## 2011-01-27 NOTE — Miscellaneous (Signed)
  Clinical Lists Changes  Orders: Added new Service order of Est. Patient Level IV (99214) - Signed 

## 2011-01-27 NOTE — Assessment & Plan Note (Signed)
Summary: flu shot   Vitals Entered By: Jennet Maduro RN (October 07, 2010 9:28 AM) Prior Medications: KALETRA 200-50 MG TABS (LOPINAVIR-RITONAVIR) Take 2 tablets by mouth two times a day TRUVADA 200-300 MG TABS (EMTRICITABINE-TENOFOVIR) Take 1 tablet by mouth once a day VIVELLE-DOT 0.05 MG/24HR PTTW (ESTRADIOL) Apply to skin 2 times a week PERCOCET 10-325 MG TABS (OXYCODONE-ACETAMINOPHEN) Take 1 tablet by mouth every 6 hours as needed for pain PROZAC 40 MG CAPS (FLUOXETINE HCL) Take 1 tablet by mouth once a day ATENOLOL 50 MG TABS (ATENOLOL) Take 1 tablet by mouth once a day Current Allergies: No known allergies  Immunizations Administered:  Influenza Vaccine # 1:    Vaccine Type: Fluvax MCR    Site: left deltoid    Mfr: novartis    Dose: 0.5 ml    Route: IM    Given by: Jennet Maduro RN    Exp. Date: 03/27/2011    Lot #: [id number]  Flu Vaccine Consent Questions:    Do you have a history of severe allergic reactions to this vaccine? no    Any prior history of allergic reactions to egg and/or gelatin? no    Do you have a sensitivity to the preservative Thimersol? no    Do you have a past history of Guillan-Barre Syndrome? no    Do you currently have an acute febrile illness? no    Have you ever had a severe reaction to latex? no    Vaccine information given and explained to patient? yes    Are you currently pregnant? no  Orders Added: 1)  Influenza Vaccine MCR [00025]

## 2011-01-27 NOTE — Letter (Signed)
Summary: [facility] [employer]: Disability Re-Exam  [facility]: Disability Re-Exam   Imported By: Florinda Marker 06/11/2010 08:58:53  _____________________________________________________________________  External Attachment:    Type:   Image     Comment:   External Document

## 2011-01-28 ENCOUNTER — Other Ambulatory Visit (INDEPENDENT_AMBULATORY_CARE_PROVIDER_SITE_OTHER): Payer: Medicare Other

## 2011-01-28 ENCOUNTER — Encounter: Payer: Self-pay | Admitting: Adult Health

## 2011-01-28 ENCOUNTER — Other Ambulatory Visit: Payer: Self-pay | Admitting: Internal Medicine

## 2011-01-28 DIAGNOSIS — B20 Human immunodeficiency virus [HIV] disease: Secondary | ICD-10-CM

## 2011-01-28 LAB — CONVERTED CEMR LAB
ALT: 14 units/L (ref 0–35)
AST: 15 units/L (ref 0–37)
BUN: 11 mg/dL (ref 6–23)
Basophils Absolute: 0 10*3/uL (ref 0.0–0.1)
Calcium: 9.3 mg/dL (ref 8.4–10.5)
Chloride: 107 meq/L (ref 96–112)
Creatinine, Ser: 0.79 mg/dL (ref 0.40–1.20)
Eosinophils Absolute: 0.1 10*3/uL (ref 0.0–0.7)
Eosinophils Relative: 2 % (ref 0–5)
HCT: 41.7 % (ref 36.0–46.0)
Hemoglobin: 13.6 g/dL (ref 12.0–15.0)
Lymphocytes Relative: 47 % — ABNORMAL HIGH (ref 12–46)
Lymphs Abs: 2.4 10*3/uL (ref 0.7–4.0)
MCV: 95.6 fL (ref 78.0–100.0)
Monocytes Absolute: 0.3 10*3/uL (ref 0.1–1.0)
RDW: 12.8 % (ref 11.5–15.5)
Total Bilirubin: 0.5 mg/dL (ref 0.3–1.2)
Total CHOL/HDL Ratio: 4.4
VLDL: 23 mg/dL (ref 0–40)

## 2011-01-28 LAB — T-HELPER CELL (CD4) - (RCID CLINIC ONLY)
CD4 % Helper T Cell: 30 % — ABNORMAL LOW (ref 33–55)
CD4 T Cell Abs: 700 uL (ref 400–2700)

## 2011-02-03 ENCOUNTER — Telehealth: Payer: Self-pay | Admitting: Adult Health

## 2011-02-10 ENCOUNTER — Encounter: Payer: Self-pay | Admitting: Adult Health

## 2011-02-10 ENCOUNTER — Ambulatory Visit (INDEPENDENT_AMBULATORY_CARE_PROVIDER_SITE_OTHER): Payer: Medicare Other | Admitting: Adult Health

## 2011-02-10 ENCOUNTER — Ambulatory Visit: Payer: Self-pay | Admitting: Internal Medicine

## 2011-02-10 DIAGNOSIS — B20 Human immunodeficiency virus [HIV] disease: Secondary | ICD-10-CM

## 2011-02-10 DIAGNOSIS — M5030 Other cervical disc degeneration, unspecified cervical region: Secondary | ICD-10-CM

## 2011-02-10 DIAGNOSIS — E785 Hyperlipidemia, unspecified: Secondary | ICD-10-CM

## 2011-02-10 DIAGNOSIS — M5137 Other intervertebral disc degeneration, lumbosacral region: Secondary | ICD-10-CM

## 2011-02-16 NOTE — Progress Notes (Addendum)
Summary: Pain Med request   Phone Note Call from Patient   Reason for Call: Acute Illness Summary of Call: Pt called for refill of Percocet.   Return call no answer. We will advise the patient Dr Philipp Deputy is no longer here. She will need to schedule OV with new provider and labs.  We will refill her pain meds only once to get her to the office visit for reval.  Tomasita Morrow RN  February 03, 2011 12:29 PM

## 2011-03-10 LAB — T-HELPER CELL (CD4) - (RCID CLINIC ONLY): CD4 T Cell Abs: 770 uL (ref 400–2700)

## 2011-03-14 ENCOUNTER — Telehealth: Payer: Self-pay | Admitting: Adult Health

## 2011-03-14 LAB — T-HELPER CELL (CD4) - (RCID CLINIC ONLY): CD4 % Helper T Cell: 33 % (ref 33–55)

## 2011-03-15 ENCOUNTER — Encounter: Payer: Self-pay | Admitting: Adult Health

## 2011-03-21 LAB — T-HELPER CELL (CD4) - (RCID CLINIC ONLY)
CD4 % Helper T Cell: 32 % — ABNORMAL LOW (ref 33–55)
CD4 T Cell Abs: 600 uL (ref 400–2700)

## 2011-03-24 NOTE — Progress Notes (Signed)
  Phone Note Call from Patient   Caller: Patient Reason for Call: Refill Medication Summary of Call: asked that her percocet be refilled./ told her it may take 2 days. to her provider. her cell is [phone]. she asked to be called when it is ready Initial call taken by: Golden Circle RN,  March 14, 2011 9:16 AM  Follow-up for Phone Call        New pain contract needs to be signed and urine drug screen needs to be obtained on next blood draw. Follow-up by: Talmadge Chad NP,  March 15, 2011 3:43 PM    Prescriptions: PERCOCET 10-325 MG TABS (OXYCODONE-ACETAMINOPHEN) Take 1 tablet by mouth every 6 hours as needed for pain  #120 x 0   Entered and Authorized by:   Talmadge Chad NP   Signed by:   Talmadge Chad NP on 03/15/2011   Method used:   Print then Give to Patient   RxID:   [id number]

## 2011-03-24 NOTE — Assessment & Plan Note (Signed)
Summary: 103month f/u new to np [mkj]   Vital Signs:  Patient profile:   55 year old female Menstrual status:  postmenopausal Height:      65 inches Weight:      140 pounds BMI:     23.38 Temp:     98.5 degrees F oral Pulse rate:   60 / minute BP sitting:   126 / 79  (right arm)  Vitals Entered By: Alesia Morin CMA (February 10, 2011 9:30 AM) CC: follow-up visit for labs she wants to talk about chantix and refill on meds Is Patient Diabetic? No Pain Assessment Patient in pain? yes     Location: arm Intensity: 7 Type: aching Nutritional Status BMI of 19 -24 = normal Nutritional Status Detail appetite "wonderful"  Have you ever been in a relationship where you felt threatened, hurt or afraid?No   Does patient need assistance? Functional Status Self care Ambulation Normal Comments no missed doses   CC:  follow-up visit for labs she wants to talk about chantix and refill on meds.  Preventive Screening-Counseling & Management  Alcohol-Tobacco     Alcohol drinks/day: 0     Alcohol type: beer, occassionally     Smoking Status: current     Smoking Cessation Counseling: yes     Packs/Day: 1.0     Passive Smoke Exposure: yes  Caffeine-Diet-Exercise     Caffeine use/day: 1     Does Patient Exercise: no  Hep-HIV-STD-Contraception     HIV Risk: no     HIV Risk Counseling: 10/11/2006  Safety-Violence-Falls     Seat Belt Use: 100      Sexual History:  currently monogamous.        Drug Use:  never.        Blood Transfusions:  no.        Travel History:  none.    Comments: pt declined condoms  Allergies (verified): No Known Drug Allergies   Medications Added to Medication List This Visit: 1)  Isentress 400 Mg Tabs (Raltegravir potassium) .... Take one (1) tablet every twelve (12) hours  Other Orders: Est. Patient Level IV (56213) Future Orders: T-CBC w/Diff ([id number]) ... 03/10/2011 T-CD4SP ([facility]) (CD4SP) ... 03/10/2011 T-Comprehensive Metabolic  Panel ([id number]) ... 03/10/2011 T-HIV Viral Load [id number]) ... 03/10/2011 T-Lipid Profile [phone]) ... 03/10/2011 Prescriptions: PERCOCET 10-325 MG TABS (OXYCODONE-ACETAMINOPHEN) Take 1 tablet by mouth every 6 hours as needed for pain  #120 x 0   Entered and Authorized by:   Talmadge Chad NP   Signed by:   Talmadge Chad NP on 02/10/2011   Method used:   Print then Give to Patient   RxID:   [id number] ISENTRESS 400 MG TABS (RALTEGRAVIR POTASSIUM) Take one (1) tablet every twelve (12) hours  #60 x 5   Entered and Authorized by:   Talmadge Chad NP   Signed by:   Talmadge Chad NP on 02/10/2011   Method used:   Electronically to        [id number] (retail)       [address]       [place], [place]  [address]       Ph: [phone]       Fax: [phone]   RxID:   [id number]    Orders Added: 1)  T-CBC w/Diff [[id number]] 2)  T-CD4SP [facility]) [CD4SP] 3)  T-Comprehensive Metabolic Panel [[id number]] 4)  T-HIV Viral Load [[id number]] 5)  T-Lipid Profile [[id number]] 6)  Est. Patient Level IV [27035]

## 2011-03-28 ENCOUNTER — Other Ambulatory Visit: Payer: Medicare Other

## 2011-03-29 ENCOUNTER — Other Ambulatory Visit (INDEPENDENT_AMBULATORY_CARE_PROVIDER_SITE_OTHER): Payer: Medicare Other

## 2011-03-29 DIAGNOSIS — B20 Human immunodeficiency virus [HIV] disease: Secondary | ICD-10-CM

## 2011-03-29 NOTE — Progress Notes (Signed)
Addended by: Mariea Clonts on: 03/29/2011 10:35 AM   Modules accepted: Orders

## 2011-03-30 LAB — T-HELPER CELL (CD4) - (RCID CLINIC ONLY)
CD4 % Helper T Cell: 33 % (ref 33–55)
CD4 % Helper T Cell: 37 % (ref 33–55)
CD4 T Cell Abs: 790 uL (ref 400–2700)
CD4 T Cell Abs: 800 uL (ref 400–2700)

## 2011-03-30 LAB — CBC WITH DIFFERENTIAL/PLATELET
Eosinophils Absolute: 0.1 10*3/uL (ref 0.0–0.7)
Hemoglobin: 14.1 g/dL (ref 12.0–15.0)
Lymphocytes Relative: 46 % (ref 12–46)
Lymphs Abs: 2.3 10*3/uL (ref 0.7–4.0)
MCH: 31.6 pg (ref 26.0–34.0)
Monocytes Relative: 6 % (ref 3–12)
Neutro Abs: 2.3 10*3/uL (ref 1.7–7.7)
Neutrophils Relative %: 46 % (ref 43–77)
Platelets: 166 10*3/uL (ref 150–400)
RBC: 4.46 MIL/uL (ref 3.87–5.11)
WBC: 5 10*3/uL (ref 4.0–10.5)

## 2011-03-30 LAB — COMPREHENSIVE METABOLIC PANEL
Albumin: 4.3 g/dL (ref 3.5–5.2)
Alkaline Phosphatase: 65 U/L (ref 39–117)
BUN: 11 mg/dL (ref 6–23)
Creat: 0.9 mg/dL (ref 0.40–1.20)
Glucose, Bld: 96 mg/dL (ref 70–99)
Total Bilirubin: 0.3 mg/dL (ref 0.3–1.2)

## 2011-03-31 LAB — HIV-1 RNA QUANT-NO REFLEX-BLD: HIV-1 RNA Quant, Log: <1.3 {Log} (ref <1.30)

## 2011-04-06 LAB — T-HELPER CELL (CD4) - (RCID CLINIC ONLY): CD4 % Helper T Cell: 32 % — ABNORMAL LOW (ref 33–55)

## 2011-04-11 ENCOUNTER — Encounter: Payer: Self-pay | Admitting: Adult Health

## 2011-04-11 ENCOUNTER — Ambulatory Visit: Payer: Medicare Other | Admitting: Internal Medicine

## 2011-04-11 ENCOUNTER — Ambulatory Visit (INDEPENDENT_AMBULATORY_CARE_PROVIDER_SITE_OTHER): Payer: Medicare Other | Admitting: Adult Health

## 2011-04-11 VITALS — BP 143/77 | HR 76 | Temp 98.8°F | Ht 66.0 in | Wt 140.0 lb

## 2011-04-11 DIAGNOSIS — Z79899 Other long term (current) drug therapy: Secondary | ICD-10-CM

## 2011-04-11 DIAGNOSIS — B20 Human immunodeficiency virus [HIV] disease: Secondary | ICD-10-CM

## 2011-04-11 DIAGNOSIS — M79603 Pain in arm, unspecified: Secondary | ICD-10-CM

## 2011-04-11 DIAGNOSIS — M79609 Pain in unspecified limb: Secondary | ICD-10-CM

## 2011-04-11 LAB — T-HELPER CELL (CD4) - (RCID CLINIC ONLY)
CD4 % Helper T Cell: 30 % — ABNORMAL LOW (ref 33–55)
CD4 T Cell Abs: 540 uL (ref 400–2700)

## 2011-04-11 MED ORDER — OXYCODONE-ACETAMINOPHEN 10-325 MG PO TABS: 1.0000 | ORAL_TABLET | Freq: Four times a day (QID) | ORAL | Status: DC | PRN

## 2011-04-11 NOTE — Progress Notes (Signed)
Subjective:    Patient ID: Dawn Byrd, female    DOB: 28-Apr-1956, 55 y.o.   MRN: [id number]  HPI In for lab f/u.  Adherent with meds with good tolerance.  Has c/o facial and hand swelling when she wakes up in morning.  Denies any respiratory complaints, rashes, sinus congestion, discomfort or pain related to symptoms.  States swelling subsides spontaneously.  Also remains with nerve pain in LUE for which she continues taking oxycodone 10/325 APAP q6h regularly.  Has not had a pain mangement evaluation, although has been seen by neurologist who told her there was nothing they could do to improve pain.   Review of Systems  Constitutional: Negative for fever, chills, diaphoresis, activity change, appetite change, fatigue and unexpected weight change.  HENT: Positive for facial swelling. Negative for hearing loss, ear pain, nosebleeds, congestion, rhinorrhea, sneezing, neck pain, neck stiffness, postnasal drip, tinnitus and ear discharge.   Eyes: Negative for photophobia, pain, discharge, redness, itching and visual disturbance.  Respiratory: Negative for apnea, cough, choking, chest tightness, shortness of breath, wheezing and stridor.   Cardiovascular: Negative for chest pain, palpitations and leg swelling.  Gastrointestinal: Negative for abdominal distention.  Genitourinary: Negative for dysuria, urgency, frequency, hematuria, flank pain, decreased urine volume, vaginal bleeding, vaginal discharge, enuresis, difficulty urinating, genital sores, vaginal pain, menstrual problem, pelvic pain and dyspareunia.  Musculoskeletal: Negative for back pain, joint swelling, arthralgias and gait problem.  Neurological: Positive for weakness. Negative for dizziness, tremors, seizures, syncope, facial asymmetry, speech difficulty, light-headedness, numbness and headaches.  Hematological: Negative for adenopathy. Does not bruise/bleed easily.  Psychiatric/Behavioral: Negative.        Objective:   Physical  Exam  Constitutional: She is oriented to person, place, and time. She appears well-developed and well-nourished. No distress.  HENT:  Head: Normocephalic and atraumatic.  Right Ear: External ear normal.  Left Ear: External ear normal.  Nose: Nose normal.  Mouth/Throat: Oropharynx is clear and moist. No oropharyngeal exudate.  Eyes: Conjunctivae and EOM are normal. Pupils are equal, round, and reactive to light. Right eye exhibits no discharge. Left eye exhibits no discharge. No scleral icterus.  Neck: Normal range of motion. Neck supple. No tracheal deviation present. No thyromegaly present.  Cardiovascular: Normal rate, regular rhythm, normal heart sounds and intact distal pulses.   Pulmonary/Chest: Effort normal and breath sounds normal.  Abdominal: Soft. Bowel sounds are normal.  Musculoskeletal: Normal range of motion. She exhibits no edema and no tenderness.  Lymphadenopathy:    She has no cervical adenopathy.  Neurological: She is alert and oriented to person, place, and time. No cranial nerve deficit. She exhibits normal muscle tone. Coordination normal.  Skin: Skin is warm and dry. She is not diaphoretic.  Psychiatric: She has a normal mood and affect. Her behavior is normal. Judgment and thought content normal.          Assessment & Plan:  HIV:  CD4 780 @ 33% with VL <20 copies/ml.  Clinically stable.  CPM  Repeat labs in 10 weeks and RTC f/u in 3 months.  Verbally acknowledged.  Facial and hand swelling:  Not certain causative factor.  Could be the manner in which she sleeps in bed.  Instructed her to sleep with HOB elevated.  If symptoms persist or worsen to contact clinic.  Nerve impingement with associated left arm pain:  We will refill her pain meds once more and make a referral to pain management clinic to have her evaluated for long-term pain management.  Verbally acknowledged and agreed with plan.

## 2011-04-14 ENCOUNTER — Telehealth: Payer: Self-pay | Admitting: *Deleted

## 2011-04-14 NOTE — Telephone Encounter (Signed)
Faxed the information to the [facility] for the patient and per their protocol they will contact the patient when the appt is made. Patient notified.

## 2011-05-10 NOTE — Op Note (Signed)
Dawn Byrd, Dawn Byrd                  ACCOUNT NO.:  [id number]   MEDICAL RECORD NO.:  [id number]          PATIENT TYPE:  AMB   LOCATION:  [facility]                         FACILITY:  [facility]   PHYSICIAN:  Jamison Neighbor, M.D.  DATE OF BIRTH:  [birth year]   DATE OF PROCEDURE:  11/27/2007  DATE OF DISCHARGE:                               OPERATIVE REPORT   PREOPERATIVE DIAGNOSIS:  Eroded sling.   POSTOPERATIVE DIAGNOSIS:  Eroded sling.   PROCEDURE:  Excision of sling with primary closure.   SURGEON:  Jamison Neighbor, M.D.   ANESTHESIA:  General.   COMPLICATIONS:  None.   DRAINS:  None.   BRIEF HISTORY:  This is a 55 year old female is 4 years out from a trans  obturator sling using the old [employer] OB tape system.  The patient did  quite well until recently when she developed evidence of an erosion.  She does have minimal urinary incontinence but for the most part is  pretty well controlled, and we felt that the most appropriate thing to  do was to review her situation, excise the sling material if possible,  and try to get a primary close before making a decision as to whether  the patient needs to have additional sling material placed.  The  patient's situation is complicated by the fact that she is HIV positive,  but she is under excellent control.  The patient is now to undergo sling  excision with possible additional sling placement.  She understands the  risks and benefits of the procedure and gave full informed consent.   PROCEDURE:  After successful induction of general anesthesia the patient  was placed in the dorsal lithotomy position, prepped with Betadine and  draped in the usual sterile fashion.  A careful bimanual examination  shows reasonably good support for the vault.  Her urethra is well  supported.  She has got a modest cystocele.  There is not much in the  way of a rectocele.  The patient had erosion on the right-hand side with  an open area of vagina back towards the  sulcus.  The right-angle clamp  was then used to grab hold of the sling and a section was excised.  Inspection showed that there was no palpable sling still back within  this open area.  The sling underneath the urethra appeared to be intact  and it was felt that this could be left alone in case it would support  her simply through the fact of the opposite side sling.  The area had  grown in and there was significant tissue ingrowth to remove and this  would be very difficult.  The portion of the sling that went back up  into the transverse obturator fossa also did not appear to be  problematic, and it was felt that this should be left alone.  After all  that area was carefully cleaned out and there was no residual sling  material, it was carefully irrigated.  The decision was made to leave  this well enough alone and simply place a loose closure.  If there is  any infection, it should drain out since this was not closed tightly.  If it turns out that the patient has significant recurrent stress  incontinence, then of course a more complete procedure will need to be  done at a later date.  This would consist of additional debridement,  placement of a new sling, and a possible  cystocele repair.  It was felt, however, that that was inappropriate at  this time.  The incision was closed with a couple of loose sutures of 2-  0 Vicryl.  The patient tolerated the procedure and was taken to recovery  in good condition.  She will be sent home on doxycycline as well as  Lorcet Plus return to see me in followup in 2 weeks.      Jamison Neighbor, M.D.  Electronically Signed     RJE/MEDQ  D:  11/27/2007  T:  11/27/2007  Job:  [id number]   cc:   [place] [facility]  Fax: [phone]   [facility]

## 2011-05-12 ENCOUNTER — Ambulatory Visit (INDEPENDENT_AMBULATORY_CARE_PROVIDER_SITE_OTHER): Payer: Medicare Other | Admitting: Adult Health

## 2011-05-12 DIAGNOSIS — M79603 Pain in arm, unspecified: Secondary | ICD-10-CM

## 2011-05-12 DIAGNOSIS — Z79899 Other long term (current) drug therapy: Secondary | ICD-10-CM

## 2011-05-12 DIAGNOSIS — Z139 Encounter for screening, unspecified: Secondary | ICD-10-CM

## 2011-05-12 DIAGNOSIS — M79609 Pain in unspecified limb: Secondary | ICD-10-CM

## 2011-05-12 DIAGNOSIS — B20 Human immunodeficiency virus [HIV] disease: Secondary | ICD-10-CM

## 2011-05-12 DIAGNOSIS — Z1389 Encounter for screening for other disorder: Secondary | ICD-10-CM

## 2011-05-12 DIAGNOSIS — M5137 Other intervertebral disc degeneration, lumbosacral region: Secondary | ICD-10-CM

## 2011-05-12 DIAGNOSIS — M5030 Other cervical disc degeneration, unspecified cervical region: Secondary | ICD-10-CM

## 2011-05-12 MED ORDER — OXYCODONE-ACETAMINOPHEN 10-325 MG PO TABS: 1.0000 | ORAL_TABLET | Freq: Four times a day (QID) | ORAL | Status: DC | PRN

## 2011-05-12 NOTE — Progress Notes (Signed)
Ms. Holzman presents today with issues regarding her referral made to pain management clinic, which she was not able to afford the payments or the request of diagnostic studies he wanted her to have. She asked if this clinic to continue her pain management as it was previously arranged. She stated she stopped smoking marijuana and is willing to provide urine drug toxicity screenings every time she comes to pick up pain medication. Proactively, she addressed her understanding of stipulations regarding her pain management, including the risk of discharge from the clinic should she violate certain aspects of her contract. In discussion with her, it was explained that the volume, the patient's we are currently managing who have required chronic pain management in the past has become more problematic than helpful, and our focus on pain management has detracted from her ability to fully manage HIV, for which we are charged to provide care. She verbalized her understanding of this, but admitted she would not have issue with the guidelines that would be established for her pain management. After careful consideration and review of her underlying problems, associated with her pain, it was decided that we will continue to provide pain management with the understanding that every time. She comes in to clinic for for her prescriptions, she will need to provide a urine drug toxicity screen, and if any abnormalities are found outside her routine prescribed medications, she will no longer receive pain medication from the clinic or this provider. She verbally acknowledged this, agreed with plan of care, and willingly is submitted. A urine sample for drug screening today. We will continue to prescribe for her. Her oxycodone as is outlined in her med profile with one-month supply provided at a time. She is to have labs repeated in 6 weeks with a followup in 2 months. She verbally acknowledged this information and agreed with plan of  care.

## 2011-05-12 NOTE — Progress Notes (Signed)
  Subjective:    Patient ID: Dawn Byrd, female    DOB: 1956-11-23, 55 y.o.   MRN: [id number]  HPI    Review of Systems     Objective:   Physical Exam        Assessment & Plan:

## 2011-05-13 LAB — DRUG SCREEN, URINE
Amphetamine Screen, Ur: NEGATIVE
Cocaine Metabolites: NEGATIVE
Creatinine,U: 30.4 mg/dL
Phencyclidine (PCP): NEGATIVE

## 2011-05-13 NOTE — Op Note (Signed)
NAME:  Dawn Byrd, Dawn Byrd                            ACCOUNT NO.:  [id number]   MEDICAL RECORD NO.:  [id number]                   PATIENT TYPE:  INP   LOCATION:  3010                                 FACILITY:  [facility]   PHYSICIAN:  Danae Orleans. Venetia Maxon, M.D.               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   POSTOPERATIVE DIAGNOSIS:  Herniated cervical C4-5 and C5-6, with  spondylosis, degenerative disk disease, and radiculopathy.   PROCEDURES:  1. Removal of plate, Z6-1.  2. Anterior cervical decompression and fusion, C4-5 and C5-6 levels,with     allograft bone graft and anterior cervical plate.   SURGEON:  Danae Orleans. Venetia Maxon, M.D.   ASSISTANT:  Hewitt Shorts, M.D.   ANESTHESIA:  General endotracheal anesthesia.   ESTIMATED BLOOD LOSS:  Minimal.   COMPLICATIONS:  None.   DISPOSITION:  To recovery.   INDICATIONS:  Dawn Byrd is a 55 year old woman who is HIV-positive with  good control of her HIV infection, who had anterior cervical decompression  and fusion at C6-7 four years ago.  She has now developed spondylosis and  foraminal stenosis causing nerve root compression at the C5-6 and C6-7 level  on the left and has intractable neck and left arm pain.  It was elected to  take her to surgery for anterior cervical decompression and fusion at these  affected levels with removal of the plate at the W9-6 level.   PROCEDURE:  [name] was brought to the operating room.  Following the  satisfactory and uncomplicated induction of general endotracheal anesthesia  and placement of intravenous lines, the patient was placed in the supine  position on the operating table and her neck was placed in slight extension,  and she was placed in 10 pounds of Holter traction.  Her  anterior neck was  then prepped and draped in the usual sterile fashion.  The area of planned  incision was infiltrated with 0.25% Marcaine and 0.5% lidocaine with  1:200,000 epinephrine.  The previous incision was reopened and the anterior  border of the sternocleidomastoid muscle was identified.  Blunt dissection  was performed, keeping the carotid sheath lateral and the trachea and  esophagus medial, exposing the anterior cervical spine.  The previously-  placed plate was then removed and the longus colli muscles were taken down  from the anterior cervical spine from C4 through C6 levels bilaterally using  electrocautery and Key elevator.  The Shadow Line self-retaining retractor  was placed to facilitate exposure.  The ventral osteophyte at C5-6 was then  removed.  The disk spaces at C4-5 and C5-6 were incised and disk material  was removed in piecemeal fashion.  End plates were stripped  of residual disk  material using Carlens curettes.  The microscope was brought into the field.  Initially at the C4-5 level the end plates were decorticated at C4-5 and the  posterior longitudinal ligament was then incised and removed in piecemeal  fashion, resulting in significant decompression of the spinal cord dura and  both C4-5 neural foramina.  Hemostasis was assured with Gelfoam soaked in  thrombin and using dummy sizers, a 7 mm corticocancellous spacer was  selected and placed in the interspace and counter sunk appropriately.  Attention was then turned to the C5-6 level, where a similar decompression  was performed.  Large uncinate spurs were drilled down and this resulted in  significant decompression of both C6 nerve roots as they extended out the  neural foramina as well as the __________ spinal cord dura.  Hemostasis was  again assured and the 8 mm bone graft was inserted at this level.  A 46 mm  Trinica anterior cervical plate was then affixed to the anterior cervical  spine using  variable-angled 4.6 mm screws at C6 and variable-angled 4 x 14  mm screws at C5 and C4 levels.  All screws had excellent purchase.  Locking  mechanisms were engaged.  Final x-rays demonstrated well-positioned bone  grafts and anterior cervical plate.  The wound was then irrigated with  bacitracin and saline, soft tissues were inspected and found to be in good  repair, and no evidence of any bleeding.  The platysmal layer was then  closed with 0 Vicryl sutures and the skin edges were reapproximated with  running 4-0 Vicryl subcuticular stitch.  The wound was dressed with  Dermabond.  The patient was extubated in the operating room and taken to the  recovery room in stable and satisfactory condition, having tolerated her  operation well.  Counts were correct at the end of the case.                                               Danae Orleans. Venetia Maxon, M.D.    JDS/MEDQ  D:  07/18/2003  T:  07/18/2003  Job:  [id number]

## 2011-05-13 NOTE — Discharge Summary (Signed)
Dawn Byrd, Dawn Byrd                  ACCOUNT NO.:  [id number]   MEDICAL RECORD NO.:  [id number]          PATIENT TYPE:  INP   LOCATION:  5709                         FACILITY:  [facility]   PHYSICIAN:  Duncan Dull, M.D.     DATE OF BIRTH:  08-Feb-1956   DATE OF ADMISSION:  02/11/2007  DATE OF DISCHARGE:  02/15/2007                               DISCHARGE SUMMARY   DISCHARGE DIAGNOSES:  1. Bilateral pneumonia, community acquired versus viral versus      atypical.  2. Altered mental status, secondary to acute infection and Histonex      use, with negative lumbar puncture and normal head CT this      admission.  3. Human immunodeficiency virus; CD4 count of 320 this admission,      previously 660 in October2007.  4. History of migraine headaches.  5. Cervical spine degenerative disk disease, status post surgeries in      January2000 and June2004,  6. Lumbar spine degenerative disk disease, status post surgery in      June2003.  7. Obsessive-compulsive disorder,  8. History of dysuria and hematuria with hemorrhagic cystitis in      January2005,  9. Normocytic anemia, likely of chronic disease,  10.Status post hysterectomy.  11.Status post oophorectomy.   DISCHARGE MEDICATIONS:  1. Avelox 500 mg p.o. daily times 10 days.  2. Kaletra 200/50 two tablets p.o. b.i.d.  3. Fluoxetine 20 mg p.o. daily.  4. Folic acid 1 mg p.o. daily.  5. Propranolol 40 mg p.o. b.i.d.  6. Truvada  Of note, the patient was instructed to discontinue the use of his  Histonex secondary to concern of exacerbating altered mental status.   DISPOSITION AND FOLLOWUP:  The patient is being discharged from [facility] in stable condition.  While she is still does have some  cough present, she is notably improved clinically.  She is to follow up  with her regular physician, Dr. Burnice Logan 55 in Infectious Disease  Clinic, first on March3,2008, at 10:30 a.m. for a lab draw.  She will  then return to the  clinical March17,2008, at 3:00 p.m. for a clinic  appointment / hospital follow up with Dr. Roxan Hockey.  At that point, I  will defer to Dr. Roxan Hockey to assess for full resolution of her  pulmonary symptoms from this bilateral pneumonia.  Of note to Dr.  Roxan Hockey, CD4 count, as noted above, had dropped to 320; however,  primary team was wondering if this was an acute reaction to her illness  during this hospitalization.  We are discharging her on the same  medications in terms antiretrovirals as those on admission.   PROCEDURES PERFORMED:  1. Lumbar puncture on February17,2008, was performed under fluoroscopy      by radiology.  The patient tolerated this without any difficulties.      Results from this will be dictated under lab section.  2. CT head without contrast revealed no acute intracranial      abnormality.  Acute sinusitis likely superimposed on a mild chronic      sinusitis  3. A chest x-ray revealed patchy bibasilar opacities, left worse than      right, most compatible with pneumonia; somewhat nodular density in      the right upper lobe which could represent early infiltrate or      nodule, followup chest x-ray recommended.  4. Repeat chest x-ray revealed improving persistent patchy bibasilar      and left or right upper lobe pneumonitis, otherwise stable  5. CT chest revealed extensive patchy bilateral air space opacities      most pronounced in the lower lobe but involving all lobes, most      compatible multifocal pneumonia and mild mediastinal adenopathy.   CONSULTATIONS:  Dr. Cliffton Asters of infectious disease.   BRIEF ADMISSION HISTORY AND PHYSICAL:  Dawn Byrd is a 55 year old  white woman with a past medical history significant for HIV with the  most recent CD4 count of 660 in October2007, and an undetectable viral  load, who presented to the [facility] ED with chief complaints of cough  productive of yellow sputum, fever, chills, nasal congestion for the   past 8-10 days.  She reports that she had received antibiotics from her  ID physician, although she could not remember which medication it was or  for what indication.  She felt better subjectively for a short while  after taking the antibiotics; however, 3 days prior to admission she  started feeling worse.  On the day of admission, she had been coughing  profusely with some pleuritic chest pain, worse with deep inspiration  and cough.  Several hours prior to admission, she felt tired and tolder  her family that she wanted to take a nap.  The patient's daughter tried  to wake her from the nap but found the patient to be confused and  disoriented.  The patient did not know where she was or who her daughter  was.  The daughter brought her to the emergency room urgently for  evaluation.  For further details, please see the chart.   VITAL SIGNS:  On admission her temperature was 101.2 degrees Fahrenheit,  blood pressure was 95/58, pulse of 78, respiratory rate of 20, O2 sats  were 96% on room air.  GENERAL:  In general, this is a normal-appearing middle age white woman  in no apparent distress.  EYES:  Pupils equally round and reactive to light.  Extraocular muscles  intact.  OROPHARYNX:  Revealed poor dentition and dry mucous membranes.  NECK:  Supple.  No JVD or lymphadenopathy.  No rigidity.  RESPIRATORY:  Exam revealed decreased air movement, bibasilar crackles.  CARDIOVASCULAR:  She had a regular rate and rhythm.  No murmurs, rubs or  gallops.  ABDOMEN:  Soft, nontender, nondistended.  Active bowel sounds.  EXTREMITIES:  Revealed no cyanosis, clubbing, edema.  SKIN:  Moist, warm.  There was a question of a palmar rash bilaterally.  MUSCULOSKELETAL:  She is moving all 4 extremities equally.  No joint  abnormalities, no effusions or swellings or erythema.  NEUROLOGIC:  Cranial nerves II-XII were grossly intact.  She had no focal deficits in strength or sensation.  The patient was  following  commands, but entirely disoriented and essentially nonverbal.  She would  follow simple commands but, again, was notably confused.  She had no  neck rigidity.   ADMISSION LABORATORY:  Sodium 132, potassium 2.6, chloride 97, bicarb  21, BUN 9, creatinine 0.9, glucose of 115.  White blood cell count 7.3,  hemoglobin 11.9, platelets  206.  Urinalysis revealed cloudy yellow  urine, had a moderate amount of leukocyte esterase and micro revealed 11-  20 white blood cells, 3-6 red blood cells and many bacteria.  Protein  6.3, calcium 8.0.  Initial arterial blood gas was notable for a pH of  7.5, a pCO2 of 29, a pO2 of 53, and a bicarb of 23.6, although O2 sats  were 91%.  Please see the above imaging studies for further details of  admission imaging.   1. Pneumonia.  The patient was admitted for IV antibiotics; however,      given her altered mental status, there was initial concern by night      coverage for the possibility of meningitis versus [facility]      Spotted Fever versus AIDS-associated infection versus other.  For      this, the patient received broad-spectrum coverage with Rocephin,      doxycycline, imipenem, vancomycin as well as IV Decadron.      Following the administration of IV antibiotics and IV fluids, the      patient drastically improved.  On the second day of admission, she      still had a cough; however, she was no longer febrile and felt much      improved.  Infectious disease evaluated the patient and by that      time she had much improved and they recommended continuing with the      ceftriaxone while trimming down the other broad-spectrum      antibiotics.  The patient tolerated this well and was treated in      total with 4 doses of Rocephin.  Both of her blood cultures were      negative.  I am discharging her with an additional 10 days of      antibiotics in the form of Avelox 500 mg to be taken orally once a      day.   1. Altered mental  status/delirium.  There was a great deal of concern      upon the patient's admission given her acutely depressed cognitive      function.  The patient was essentially aphasic; however, she was      fully alert and following simple commands.  As mentioned      previously, the admitting team overnight was concerned with the      possibility of meningitis for which she underwent lumbar puncture      which was performed by interventional radiology.  Gram stain from      her CSF revealed only white blood cells, predominantly mononuclear,      and with no organisms seen.  Final culture from that revealed only      white blood cells with no organisms seen.  That is the final      report.  Again, as mentioned, her sensorium greatly cleared      approximately 12-20 hours after receiving IV fluids and      antibiotics.  The night float team was initially concerned  with      the possibility of a [facility] Spotted Fever as there was     slight erythema noted on the hypothenar prominences of bilateral      hands.  However, with serial examinations, these just appeared to      be normal, kind of fleshy tone to her bilateral hands which her      long-time boyfriend noted was always present.  I  think this is a      normal anatomic variant and really has no clinical signs of rash or      infection.  Other studies of note, a VDRL of the CSF was performed      which was negative.  A [place] Spotted Fever IgG was 0.12      which can be interpreted as negative.  Cryptococcal antigen was      negative.  Influenza A and B antigen was negative.  The patient's      cognitive function and orientation were entirely normal for the      duration of her hospitalization, and I think her initial      presentation can be chalked up to the acute infection as well as      the possibility of med effect.  Patient had been evaluated in      [facility] and had been given a prescription of histonex.       Of note, the patient was instructed to discontinue the use of his      Histonex which is combination of opiate cough suppressant as well      as several other ingredients.  The patient was likely dehydrated      from her acute febrile illness as well as overly medicated.  She      has been instructed to not take this medication again.   1. HIV.  CD4 count, as mentioned previously, waned somewhat during      this hospitalization.  I will defer to Dr. Roxan Hockey as to whether      this is secondary to her acute illness or not.   1. Bacturia.  The patient essentially had asymptomatic bacteria.  She      does have a history of UTI in the past and hemorrhagic cystitis.      However, the patient has received a large load of broad-spectrum      antibiotics (please see above discussion of pneumonia).  I believe      we have more than adequately treated this, whether this needed to      be covered or not.   1. Hypokalemia.  After her sensorium cleared, the patient reported to      [place] that she had actually had a large amount of nausea, vomiting and      diarrhea associated with her respiratory illness for several days.      This is the likely etiology of her hypokalemia.  Her potassium was      repleted and normalized by the second day.   1. Anemia, normocytic.  Iron studies were checked to rule out any iron      deficiency.  Her iron was somewhat low at 15, TIBC was low at 205,      percent sat was low at 7.  This probably is more consistent with      anemia of chronic disease than any form of iron deficiency.  If it      is a concern as an outpatient, certainly she can be followed with      Hemoccult stool cards or repeat CBC.  The day of discharge her      hemoglobin is 10.6, whereas the day before it had been 11.4.  I      will obtain a CBC prior to her discharge, as I think some of this      is likely in a range of acceptable  lab error.   1. History of migraine headache.  The patient  repeatedly complained of     headache during this hospitalization; however, she did not feel      they were migraine in origin.  In addition, her symptomatology was      not consistent with migraine.  I treated this conservatively with      p.r.n. Tylenol and ibuprofen which she tolerated well without      complications.   DISCHARGE LABORATORY AND VITALS ON DAY OF DISCHARGE:  Temperature is  98.6, blood pressure 154/82, heart rate of 57, respiratory rate of 18,  O2 sats 94-97% on room air.  Her ambulatory O2 sats were 94-97% on room  air.  She was moving decent air.  Labs:  White blood cell count 5.7,  hemoglobin 10.6, platelets of 342.  Sodium 139, potassium 3.7, chloride  109, bicarb 25, BUN 11, creatinine 0.49 and glucose of 90.   MICROBIOLOGY DATA:  Urine culture revealed no growth at the final  report.  Blood cultures x2 revealed no growth to date.  The preliminary  report of CSF culture revealed no growth, 2 days, at the time of this  dictation.      Antony Contras, M.D.  Electronically Signed      Duncan Dull, M.D.  Electronically Signed    GL/MEDQ  D:  02/15/2007  T:  02/15/2007  Job:  [id number]   cc:   Rockey Situ. Flavia Shipper., M.D.  Cliffton Asters, M.D.

## 2011-05-13 NOTE — Op Note (Signed)
NAME:  Dawn Byrd, Dawn Byrd                            ACCOUNT NO.:  [id number]   MEDICAL RECORD NO.:  [id number]                   PATIENT TYPE:  AMB   LOCATION:  [facility]                                 FACILITY:  [facility]   PHYSICIAN:  Jamison Neighbor, M.D.               DATE OF BIRTH:  14-Jul-1956   DATE OF PROCEDURE:  02/24/2004  DATE OF DISCHARGE:                                 OPERATIVE REPORT   SERVICE:  Urology.   PREOPERATIVE DIAGNOSES:  Stress urinary incontinence.   POSTOPERATIVE DIAGNOSES:  Stress urinary incontinence.   PROCEDURE:  Cystoscopy and transobturator tape sling.   SURGEON:  Jamison Neighbor, M.D.   ASSISTANTIvette Loyal   ANESTHESIA:  General.   COMPLICATIONS:  None.   DRAINS:  16 French Foley catheter.   BRIEF HISTORY:  This 55 year old female has well documented stress urinary  incontinence. The patient does not have a significant cystocele or rectocele  so more reconstructive surgery is not required. The patient is interested in  doing something to fix her stress incontinence. She has elected to undergo  an OB taped sling. She understands the risks and benefits of the procedure  which include erosion, infection as well as under or over correction. The  patient gave full informed consent.   DESCRIPTION OF PROCEDURE:  After successful induction of general anesthesia,  the patient was placed in the dorsal lithotomy position, prepped with  Betadine and draped in the usual sterile fashion. Careful bimanual  examination showed a very modest cystocele and certainly nothing that would  require additional surgical intervention aside from the sling. There was no  enterocele and there was no significant rectocele. The perineal body  appeared appropriate. The patient had a weighted vaginal speculum placed,  the Foley catheter was inserted. The anterior vaginal mucosa over the mid  portion of the urethra was then infiltrated with local anesthetic. An  incision was made over  the mid urethra and flaps of mucosa were raised  bilaterally. The dissection on each side proceeded back to but not through  the endopelvic fascia. The bladder was then drained, an incision was made at  the level of the clitoris in the groin crease.  The needle passers were then  passed from these two incisions into the vaginal incision.  On the right  hand side this had to be repositioned when it pierced through the mucosa.  The sling was then sat with an appropriate degree of tension, cystoscopy was  performed with both 12 degree and 70 degree lenses. Inspection showed that  there was no injury to the bladder. The 12 degree lens used to carefully  assess the bladder neck and appeared to be appropriately supported but not  over corrected. With a full bladder there was no loss of urine but with the  Crede maneuver there was a prompt straight flow of urine indicating no over  correction. The area was carefully irrigated, the incision was closed with a  running suture of Vicryl. A single stitch of Vicryl was placed in the small  mucosal area on the right hand side. The area was then carefully irrigated,  the patient's Foley catheter removed in the recovery room. A pack was placed  and was removed when the patient was ready to urinate. The patient will be  sent home later today if she voids.  If she does not void, she will given  the option of learning in and out catheterization or will have a Foley  catheter for several days. If long-term, the patient does not urinate she  will be brought back within the next two weeks for sling manipulation to  loosen the sling slightly but that is not anticipated to be likely. She will  be sent home with Cipro as well as Lorcet plus and return to the office in  followup next week.                                               Jamison Neighbor, M.D.    RJE/MEDQ  D:  02/24/2004  T:  02/24/2004  Job:  [id number]   cc:   Rockey Situ. Flavia Shipper., M.D.  [address]  [place]  [place] [address]  Fax: [phone]

## 2011-06-08 ENCOUNTER — Other Ambulatory Visit: Payer: Self-pay | Admitting: *Deleted

## 2011-06-08 DIAGNOSIS — M79603 Pain in arm, unspecified: Secondary | ICD-10-CM

## 2011-06-08 MED ORDER — OXYCODONE-ACETAMINOPHEN 10-325 MG PO TABS: 1.0000 | ORAL_TABLET | Freq: Four times a day (QID) | ORAL | Status: DC | PRN

## 2011-06-08 NOTE — Telephone Encounter (Signed)
Pt will get it thur or fri. Knows it cannot be filled until the 17th

## 2011-06-10 ENCOUNTER — Other Ambulatory Visit: Payer: Self-pay | Admitting: *Deleted

## 2011-06-10 DIAGNOSIS — M79603 Pain in arm, unspecified: Secondary | ICD-10-CM

## 2011-06-27 ENCOUNTER — Other Ambulatory Visit: Payer: Self-pay | Admitting: *Deleted

## 2011-06-27 DIAGNOSIS — I10 Essential (primary) hypertension: Secondary | ICD-10-CM

## 2011-06-27 DIAGNOSIS — F329 Major depressive disorder, single episode, unspecified: Secondary | ICD-10-CM

## 2011-06-27 DIAGNOSIS — B20 Human immunodeficiency virus [HIV] disease: Secondary | ICD-10-CM

## 2011-06-27 MED ORDER — EMTRICITABINE-TENOFOVIR DF 200-300 MG PO TABS: 1.0000 | ORAL_TABLET | Freq: Every day | ORAL | Status: DC

## 2011-06-27 MED ORDER — ATENOLOL 50 MG PO TABS: 50.0000 mg | ORAL_TABLET | Freq: Every day | ORAL | Status: DC

## 2011-06-27 MED ORDER — CITALOPRAM HYDROBROMIDE 20 MG PO TABS: 20.0000 mg | ORAL_TABLET | Freq: Every day | ORAL | Status: DC

## 2011-06-28 ENCOUNTER — Other Ambulatory Visit (INDEPENDENT_AMBULATORY_CARE_PROVIDER_SITE_OTHER): Payer: Medicare Other

## 2011-06-28 ENCOUNTER — Other Ambulatory Visit: Payer: Self-pay | Admitting: Infectious Diseases

## 2011-06-28 DIAGNOSIS — Z79899 Other long term (current) drug therapy: Secondary | ICD-10-CM

## 2011-06-28 DIAGNOSIS — B20 Human immunodeficiency virus [HIV] disease: Secondary | ICD-10-CM

## 2011-06-28 LAB — CBC WITH DIFFERENTIAL/PLATELET
Eosinophils Absolute: 0.1 10*3/uL (ref 0.0–0.7)
Eosinophils Relative: 2 % (ref 0–5)
HCT: 43.1 % (ref 36.0–46.0)
Lymphocytes Relative: 54 % — ABNORMAL HIGH (ref 12–46)
Lymphs Abs: 2.5 10*3/uL (ref 0.7–4.0)
MCH: 32.4 pg (ref 26.0–34.0)
MCV: 96.9 fL (ref 78.0–100.0)
Monocytes Absolute: 0.3 10*3/uL (ref 0.1–1.0)
Monocytes Relative: 6 % (ref 3–12)
Platelets: 167 10*3/uL (ref 150–400)
RBC: 4.45 MIL/uL (ref 3.87–5.11)
WBC: 4.6 10*3/uL (ref 4.0–10.5)

## 2011-06-28 LAB — COMPREHENSIVE METABOLIC PANEL
ALT: 15 U/L (ref 0–35)
BUN: 15 mg/dL (ref 6–23)
CO2: 27 mEq/L (ref 19–32)
Calcium: 9.6 mg/dL (ref 8.4–10.5)
Chloride: 106 mEq/L (ref 96–112)
Creat: 0.84 mg/dL (ref 0.50–1.10)
Glucose, Bld: 96 mg/dL (ref 70–99)
Total Bilirubin: 0.4 mg/dL (ref 0.3–1.2)

## 2011-06-28 LAB — LIPID PANEL
HDL: 49 mg/dL (ref >39)
LDL Cholesterol: 144 mg/dL — ABNORMAL HIGH (ref 0–99)
Total CHOL/HDL Ratio: 4.3 Ratio
Triglycerides: 88 mg/dL (ref <150)

## 2011-06-30 LAB — HIV-1 RNA QUANT-NO REFLEX-BLD: HIV 1 RNA Quant: <20 copies/mL (ref <20)

## 2011-07-12 ENCOUNTER — Encounter: Payer: Self-pay | Admitting: Adult Health

## 2011-07-12 ENCOUNTER — Ambulatory Visit (INDEPENDENT_AMBULATORY_CARE_PROVIDER_SITE_OTHER): Payer: Medicare Other | Admitting: Adult Health

## 2011-07-12 VITALS — BP 111/74 | HR 60 | Temp 98.5°F | Wt 149.2 lb

## 2011-07-12 DIAGNOSIS — G4700 Insomnia, unspecified: Secondary | ICD-10-CM | POA: Insufficient documentation

## 2011-07-12 DIAGNOSIS — R110 Nausea: Secondary | ICD-10-CM

## 2011-07-12 DIAGNOSIS — E785 Hyperlipidemia, unspecified: Secondary | ICD-10-CM

## 2011-07-12 DIAGNOSIS — M79603 Pain in arm, unspecified: Secondary | ICD-10-CM

## 2011-07-12 DIAGNOSIS — Z139 Encounter for screening, unspecified: Secondary | ICD-10-CM

## 2011-07-12 DIAGNOSIS — Z0000 Encounter for general adult medical examination without abnormal findings: Secondary | ICD-10-CM

## 2011-07-12 DIAGNOSIS — B20 Human immunodeficiency virus [HIV] disease: Secondary | ICD-10-CM

## 2011-07-12 DIAGNOSIS — Z23 Encounter for immunization: Secondary | ICD-10-CM

## 2011-07-12 DIAGNOSIS — M79609 Pain in unspecified limb: Secondary | ICD-10-CM

## 2011-07-12 DIAGNOSIS — M5137 Other intervertebral disc degeneration, lumbosacral region: Secondary | ICD-10-CM

## 2011-07-12 MED ORDER — PNEUMOCOCCAL VAC POLYVALENT 25 MCG/0.5ML IJ INJ: 0.5000 mL | INJECTION | Freq: Once | INTRAMUSCULAR | Status: AC

## 2011-07-12 MED ORDER — ONDANSETRON HCL 4 MG PO TABS: 4.0000 mg | ORAL_TABLET | Freq: Three times a day (TID) | ORAL | Status: AC | PRN

## 2011-07-12 MED ORDER — OXYCODONE-ACETAMINOPHEN 10-325 MG PO TABS: 1.0000 | ORAL_TABLET | Freq: Four times a day (QID) | ORAL | Status: DC | PRN

## 2011-07-12 NOTE — Progress Notes (Signed)
Subjective:    Patient ID: Dawn Byrd, female    DOB: 01-Nov-1956, 55 y.o.   MRN: [id number]  HPI Presents to clinic today for routine scheduled followup. Remains adherent to her antiretrovirals with good tolerance and no complications. However, in combination with all her other medications, she does relate nausea, but not certain what is causing the nausea. She still takes her oxycodone for her chronic pain in addition to all her other medications. She also is relating difficulty sleeping at night, stating that she is able to fall asleep, but she keeps waking up. She states that she takes her pain medication around 8 PM in the evening.   Review of Systems  Constitutional: Negative.   HENT: Negative.   Eyes: Negative.   Gastrointestinal:       Nausea after taking medications as per history of present illness  Genitourinary: Negative.   Musculoskeletal: Positive for myalgias and arthralgias.  Skin: Negative.   Neurological: Negative.   Hematological: Negative.   Psychiatric/Behavioral: Positive for sleep disturbance.       Objective:   Physical Exam  Constitutional: She is oriented to person, place, and time. She appears well-developed and well-nourished. No distress.  HENT:  Head: Normocephalic and atraumatic.  Eyes: Conjunctivae and EOM are normal. Pupils are equal, round, and reactive to light.  Neck: Normal range of motion. Neck supple.  Cardiovascular: Normal rate, regular rhythm, normal heart sounds and intact distal pulses.   Pulmonary/Chest: Effort normal and breath sounds normal.  Abdominal: Soft. Bowel sounds are normal.  Musculoskeletal: She exhibits no edema and no tenderness.  Neurological: She is alert and oriented to person, place, and time. No cranial nerve deficit. She exhibits normal muscle tone. Coordination normal.  Skin: Skin is warm and dry.  Psychiatric: She has a normal mood and affect. Her behavior is normal. Judgment and thought content normal.           Assessment & Plan:  1. HIV. Labs obtained 06/28/2011. Show a CD4 count of 850 at 33% with a viral load less than 20 copies per mL. Clinically stable on current regimen. Recommend continuing present management, following up in 4 months, repeat labs 2 weeks before next appointment.  2. Nausea. Not certain what might be causing this problem as she is taking multiple medications at the same time. I recommended that she try splitting up the doses of all her medications except her HIV meds to determine whether or not it is the combination of the medications or a single medication that might be causing the problem. In the meantime, we'll prescribe for her ondansetron. 4 mg by mouth every 8 hours when necessary. We will recommend that once she is able to fine, which medication might be causing the nausea, she should take a dose of the dens, and trunk. 30 minutes before taking the medication.  3. Insomnia. This might be associated with a side effect associated with chronic long-term use of narcotic analgesics. Recommend she try taking her pain medication a little earlier in the evening and see, if this helps. Meanwhile, would additionally recommend diphenhydramine 25-50 mg before bedtime and see, if this works for her.  4. Dyslipidemia. Her total cholesterol, was 211 mg/dL, and her LDL was 409 mg/dL. She relates that she did eat heavy. The night before and was not fasting for these labs. Recommend at present, that she light. The day before her blood draw and that she fast 8-10 hours just prior to the blood draw. Will repeat  lipid panel on next blood draw.  5. Chronic Pain. We'll renew her oxycodone was signed contract. Also, we'll obtain routine urine drug tox. Screen.  She verbally acknowledged all information that was provided to her and agreed with plan of care.

## 2011-07-13 LAB — DRUG SCREEN, URINE
Barbiturate Quant, Ur: NEGATIVE
Marijuana Metabolite: NEGATIVE
Opiates: NEGATIVE
Phencyclidine (PCP): NEGATIVE
Propoxyphene: NEGATIVE

## 2011-08-10 ENCOUNTER — Telehealth: Payer: Self-pay | Admitting: *Deleted

## 2011-08-10 ENCOUNTER — Other Ambulatory Visit: Payer: Medicare Other

## 2011-08-10 DIAGNOSIS — M79603 Pain in arm, unspecified: Secondary | ICD-10-CM

## 2011-08-10 DIAGNOSIS — Z139 Encounter for screening, unspecified: Secondary | ICD-10-CM

## 2011-08-10 MED ORDER — OXYCODONE-ACETAMINOPHEN 10-325 MG PO TABS: 1.0000 | ORAL_TABLET | Freq: Four times a day (QID) | ORAL | Status: DC | PRN

## 2011-08-10 NOTE — Telephone Encounter (Signed)
Per provider request, she will come in today for a urine drug screen. rx to NP for signature

## 2011-08-11 LAB — DRUG SCREEN, URINE
Amphetamine Screen, Ur: NEGATIVE
Barbiturate Quant, Ur: NEGATIVE
Cocaine Metabolites: NEGATIVE
Marijuana Metabolite: NEGATIVE
Opiates: NEGATIVE
Phencyclidine (PCP): NEGATIVE

## 2011-09-08 ENCOUNTER — Other Ambulatory Visit: Payer: Self-pay | Admitting: *Deleted

## 2011-09-08 DIAGNOSIS — M79603 Pain in arm, unspecified: Secondary | ICD-10-CM

## 2011-09-08 MED ORDER — OXYCODONE-ACETAMINOPHEN 10-325 MG PO TABS: 1.0000 | ORAL_TABLET | Freq: Four times a day (QID) | ORAL | Status: DC | PRN

## 2011-09-21 LAB — T-HELPER CELL (CD4) - (RCID CLINIC ONLY)
CD4 % Helper T Cell: 32 — ABNORMAL LOW
CD4 T Cell Abs: 650

## 2011-09-28 LAB — T-HELPER CELL (CD4) - (RCID CLINIC ONLY)
CD4 % Helper T Cell: 34
CD4 T Cell Abs: 720

## 2011-10-03 LAB — POCT HEMOGLOBIN-HEMACUE
Hemoglobin: 15
Operator id: 118191

## 2011-10-10 LAB — T-HELPER CELL (CD4) - (RCID CLINIC ONLY): CD4 T Cell Abs: 720

## 2011-10-12 ENCOUNTER — Other Ambulatory Visit: Payer: Self-pay | Admitting: *Deleted

## 2011-10-12 DIAGNOSIS — M79603 Pain in arm, unspecified: Secondary | ICD-10-CM

## 2011-10-12 MED ORDER — OXYCODONE-ACETAMINOPHEN 10-325 MG PO TABS: 1.0000 | ORAL_TABLET | Freq: Four times a day (QID) | ORAL | Status: DC | PRN

## 2011-11-10 ENCOUNTER — Other Ambulatory Visit: Payer: Medicare Other

## 2011-11-10 ENCOUNTER — Ambulatory Visit (INDEPENDENT_AMBULATORY_CARE_PROVIDER_SITE_OTHER): Payer: Medicare Other | Admitting: *Deleted

## 2011-11-10 DIAGNOSIS — Z79899 Other long term (current) drug therapy: Secondary | ICD-10-CM

## 2011-11-10 DIAGNOSIS — Z139 Encounter for screening, unspecified: Secondary | ICD-10-CM

## 2011-11-10 DIAGNOSIS — Z23 Encounter for immunization: Secondary | ICD-10-CM

## 2011-11-10 DIAGNOSIS — B20 Human immunodeficiency virus [HIV] disease: Secondary | ICD-10-CM

## 2011-11-10 LAB — CBC WITH DIFFERENTIAL/PLATELET
Basophils Relative: 0 % (ref 0–1)
Eosinophils Absolute: 0.1 10*3/uL (ref 0.0–0.7)
Eosinophils Relative: 2 % (ref 0–5)
HCT: 44.3 % (ref 36.0–46.0)
Hemoglobin: 14.6 g/dL (ref 12.0–15.0)
MCH: 31.5 pg (ref 26.0–34.0)
MCHC: 33 g/dL (ref 30.0–36.0)
MCV: 95.5 fL (ref 78.0–100.0)
Monocytes Absolute: 0.3 10*3/uL (ref 0.1–1.0)
Monocytes Relative: 7 % (ref 3–12)

## 2011-11-10 LAB — COMPLETE METABOLIC PANEL WITH GFR
ALT: 24 U/L (ref 0–35)
AST: 24 U/L (ref 0–37)
Alkaline Phosphatase: 68 U/L (ref 39–117)
GFR, Est Non African American: 86 mL/min/{1.73_m2}
Glucose, Bld: 90 mg/dL (ref 70–99)
Potassium: 4.7 mEq/L (ref 3.5–5.3)
Sodium: 143 mEq/L (ref 135–145)
Total Bilirubin: 0.3 mg/dL (ref 0.3–1.2)
Total Protein: 6.7 g/dL (ref 6.0–8.3)

## 2011-11-10 LAB — LIPID PANEL
HDL: 43 mg/dL (ref >39)
Total CHOL/HDL Ratio: 4.6 Ratio
VLDL: 24 mg/dL (ref 0–40)

## 2011-11-11 LAB — T-HELPER CELL (CD4) - (RCID CLINIC ONLY): CD4 % Helper T Cell: 32 % — ABNORMAL LOW (ref 33–55)

## 2011-11-11 LAB — DRUG SCREEN, URINE
Benzodiazepines.: NEGATIVE
Creatinine,U: 62.1 mg/dL
Marijuana Metabolite: NEGATIVE
Methadone: NEGATIVE
Propoxyphene: NEGATIVE

## 2011-11-14 ENCOUNTER — Other Ambulatory Visit: Payer: Self-pay | Admitting: *Deleted

## 2011-11-14 DIAGNOSIS — M79603 Pain in arm, unspecified: Secondary | ICD-10-CM

## 2011-11-14 LAB — HIV-1 RNA QUANT-NO REFLEX-BLD: HIV 1 RNA Quant: NOT DETECTED copies/mL (ref <20)

## 2011-11-14 MED ORDER — OXYCODONE-ACETAMINOPHEN 10-325 MG PO TABS: 1.0000 | ORAL_TABLET | Freq: Four times a day (QID) | ORAL | Status: DC | PRN

## 2011-11-20 ENCOUNTER — Emergency Department (HOSPITAL_COMMUNITY)
Admission: EM | Admit: 2011-11-20 | Discharge: 2011-11-21 | Disposition: A | Discharge status: Home / Self Care | Payer: Medicare Other | Attending: Emergency Medicine | Admitting: Emergency Medicine

## 2011-11-20 ENCOUNTER — Encounter (HOSPITAL_COMMUNITY): Payer: Self-pay | Admitting: *Deleted

## 2011-11-20 ENCOUNTER — Emergency Department (HOSPITAL_COMMUNITY): Payer: Medicare Other

## 2011-11-20 ENCOUNTER — Other Ambulatory Visit: Payer: Self-pay

## 2011-11-20 DIAGNOSIS — Z79899 Other long term (current) drug therapy: Secondary | ICD-10-CM | POA: Insufficient documentation

## 2011-11-20 DIAGNOSIS — R209 Unspecified disturbances of skin sensation: Secondary | ICD-10-CM | POA: Insufficient documentation

## 2011-11-20 DIAGNOSIS — R110 Nausea: Secondary | ICD-10-CM

## 2011-11-20 DIAGNOSIS — R0602 Shortness of breath: Secondary | ICD-10-CM | POA: Insufficient documentation

## 2011-11-20 DIAGNOSIS — R42 Dizziness and giddiness: Secondary | ICD-10-CM | POA: Insufficient documentation

## 2011-11-20 DIAGNOSIS — R071 Chest pain on breathing: Secondary | ICD-10-CM | POA: Insufficient documentation

## 2011-11-20 HISTORY — DX: Human immunodeficiency virus (HIV) disease: B20

## 2011-11-20 HISTORY — DX: Essential (primary) hypertension: I10

## 2011-11-20 HISTORY — DX: Mononeuropathy, unspecified: G58.9

## 2011-11-20 LAB — COMPREHENSIVE METABOLIC PANEL
AST: 25 U/L (ref 0–37)
Albumin: 4 g/dL (ref 3.5–5.2)
Calcium: 9.4 mg/dL (ref 8.4–10.5)
Creatinine, Ser: 0.66 mg/dL (ref 0.50–1.10)
Total Protein: 6.9 g/dL (ref 6.0–8.3)

## 2011-11-20 LAB — DIFFERENTIAL
Basophils Absolute: 0 10*3/uL (ref 0.0–0.1)
Basophils Relative: 0 % (ref 0–1)
Eosinophils Relative: 2 % (ref 0–5)
Monocytes Absolute: 0.4 10*3/uL (ref 0.1–1.0)
Neutro Abs: 3.3 10*3/uL (ref 1.7–7.7)

## 2011-11-20 LAB — CBC
HCT: 41.6 % (ref 36.0–46.0)
MCHC: 32 g/dL (ref 30.0–36.0)
MCV: 93.3 fL (ref 78.0–100.0)
RDW: 12.8 % (ref 11.5–15.5)

## 2011-11-20 LAB — TROPONIN I: Troponin I: <0.3 ng/mL (ref <0.30)

## 2011-11-20 MED ORDER — SODIUM CHLORIDE 0.9 % IV SOLN
INTRAVENOUS | Status: DC
  Administered 2011-11-21: via INTRAVENOUS

## 2011-11-20 MED ORDER — ONDANSETRON HCL 4 MG/2ML IJ SOLN
4.0000 mg | Freq: Once | INTRAMUSCULAR | Status: AC
  Administered 2011-11-20: 4 mg via INTRAVENOUS
  Filled 2011-11-20: qty 2

## 2011-11-20 MED ORDER — PROMETHAZINE HCL 25 MG/ML IJ SOLN
25.0000 mg | Freq: Once | INTRAMUSCULAR | Status: AC
Start: 2011-11-20 — End: 2011-11-21
  Administered 2011-11-21: 25 mg via INTRAVENOUS
  Filled 2011-11-20: qty 1

## 2011-11-20 MED ORDER — ONDANSETRON HCL 4 MG/2ML IJ SOLN
INTRAMUSCULAR | Status: AC
  Administered 2011-11-20: 21:00:00
  Filled 2011-11-20: qty 2

## 2011-11-20 MED ORDER — ONDANSETRON HCL 4 MG/2ML IJ SOLN: 4.0000 mg | Freq: Once | INTRAMUSCULAR | Status: DC

## 2011-11-20 MED ORDER — SODIUM CHLORIDE 0.9 % IV BOLUS (SEPSIS)
500.0000 mL | Freq: Once | INTRAVENOUS | Status: AC
  Administered 2011-11-20: 500 mL via INTRAVENOUS

## 2011-11-20 NOTE — ED Provider Notes (Signed)
History     CSN: [id number] Arrival date & time: 11/20/2011  8:33 PM   First MD Initiated Contact with Patient 11/20/11 2203      Chief Complaint  Patient presents with  . Chest Pain    (Consider location/radiation/quality/duration/timing/severity/associated sxs/prior treatment) Patient is a 55 y.o. female presenting with chest pain. The history is provided by the patient.  Chest Pain Chest pain occurs constantly. The chest pain is improving. Primary symptoms include shortness of breath and nausea. Pertinent negatives for primary symptoms include no abdominal pain and no vomiting.  Pertinent negatives for associated symptoms include no numbness.    patient states that while she was watching TV this evening she developed left sharp lower chest pain. He was accompanied with nausea. No fevers. No diarrhea. She states that her lips also began to feel numb. Pain was worse with movement. She also has dizziness. The pain is decreased now. She states that she strained 4 drinks of alcohol today. She's HIV positive, but states her last CD4 count was 770. No fevers. No cough. No emesis.  Past Medical History  Diagnosis Date  . Hypertension   . HIV (human immunodeficiency virus infection)   . Nerve disorder     History reviewed. No pertinent past surgical history.  History reviewed. No pertinent family history.  History  Substance Use Topics  . Smoking status: Current Everyday Smoker -- 22.0 packs/day    Types: Cigarettes  . Smokeless tobacco: Never Used  . Alcohol Use: 1.0 oz/week    2 drink(s) per week    OB History    Grav Para Term Preterm Abortions TAB SAB Ect Mult Living                  Review of Systems  Constitutional: Negative for chills.  HENT: Negative for neck pain.   Eyes: Negative for pain.  Respiratory: Positive for shortness of breath.   Cardiovascular: Positive for chest pain.  Gastrointestinal: Positive for nausea. Negative for vomiting, abdominal pain and  anal bleeding.  Genitourinary: Negative for dysuria and flank pain.  Musculoskeletal: Negative for back pain.  Neurological: Negative for numbness.  Psychiatric/Behavioral: Negative for confusion.    Allergies  Review of patient's allergies indicates no known allergies.  Home Medications   Current Outpatient Rx  Name Route Sig Dispense Refill  . ATENOLOL 50 MG PO TABS Oral Take 50 mg by mouth daily.      Marland Kitchen CITALOPRAM HYDROBROMIDE 20 MG PO TABS Oral Take 20 mg by mouth daily.      Marland Kitchen EMTRICITABINE-TENOFOVIR 200-300 MG PO TABS Oral Take 1 tablet by mouth daily.      Marland Kitchen FOLIC ACID 400 MCG PO TABS Oral Take 400 mcg by mouth daily.      . OXYCODONE-ACETAMINOPHEN 10-325 MG PO TABS Oral Take 1 tablet by mouth every 6 (six) hours as needed. For pain     . RALTEGRAVIR POTASSIUM 400 MG PO TABS Oral Take 400 mg by mouth. Take one tab every 12 hours     . VARENICLINE TARTRATE 0.5 MG PO TABS Oral Take 0.5-1 mg by mouth See admin instructions. Take 1 tab every morning for days 1-3 then 1 tab twice daily for days 4-7     . PROMETHAZINE HCL 25 MG PO TABS Oral Take 1 tablet (25 mg total) by mouth every 6 (six) hours as needed for nausea. 20 tablet 0    BP 124/77  Pulse 66  Temp(Src) 98.4 F (36.9 C) (Oral)  Resp 18  SpO2 99%  Physical Exam  Nursing note and vitals reviewed. Constitutional: She is oriented to person, place, and time. She appears well-developed and well-nourished.  HENT:  Head: Normocephalic and atraumatic.  Eyes: EOM are normal. Pupils are equal, round, and reactive to light.  Neck: Normal range of motion. Neck supple.  Cardiovascular: Normal rate, regular rhythm and normal heart sounds.   No murmur heard. Pulmonary/Chest: Effort normal and breath sounds normal. No respiratory distress. She has no wheezes. She has no rales. She exhibits tenderness.       Mild tenderness to left lower anterior chest wall. No rash.  Abdominal: Soft. Bowel sounds are normal. She exhibits no  distension. There is no tenderness. There is no rebound and no guarding.  Musculoskeletal: Normal range of motion.  Neurological: She is alert and oriented to person, place, and time. No cranial nerve deficit.  Skin: Skin is warm and dry.  Psychiatric: She has a normal mood and affect. Her speech is normal.    ED Course  Procedures (including critical care time)  Labs Reviewed  COMPREHENSIVE METABOLIC PANEL - Abnormal; Notable for the following:    Glucose, Bld 106 (*)    Total Bilirubin 0.2 (*)    All other components within normal limits  CBC  DIFFERENTIAL  LIPASE, BLOOD  TROPONIN I  ETHANOL  URINALYSIS, ROUTINE W REFLEX MICROSCOPIC   Dg Chest 2 View  11/20/2011  *RADIOLOGY REPORT*  Clinical Data: Chest pain and shortness of breath; history of smoking.  CHEST - 2 VIEW  Comparison: Chest radiograph performed 03/26/2007  Findings: The lungs are well-aerated.  Minimal bibasilar scarring is noted.  There is no evidence of focal opacification, pleural effusion or pneumothorax.  A likely small stable calcified granuloma is noted at the right lung base.  The heart is normal in size; the mediastinal contour is within normal limits.  No acute osseous abnormalities are seen.  Cervical spinal fusion hardware is partially imaged.  IMPRESSION: No acute cardiopulmonary process seen.  Original Report Authenticated By: Tonia Ghent, M.D.     1. Nausea   2. Chest pain      Date: 11/20/2011  Rate: 64  Rhythm: normal sinus rhythm  QRS Axis: normal  Intervals: normal  ST/T Wave abnormalities: normal  Conduction Disutrbances:none  Narrative Interpretation:   Old EKG Reviewed: unchanged    MDM  Chest pain with nauseousness vomiting. EKG reassurance. Lab work reassuring, still waiting on urine sample. He does not appear to be cardiac cause for this. Patient is feeling somewhat better after Phenergan. We'll attempt orals and likely discharge home. Care turned over to Dr.  Radford Pax.        Juliet Rude. Rubin Payor, MD 11/21/11 [phone]

## 2011-11-20 NOTE — ED Notes (Signed)
zofran pulled from pyxis for EMS (given PTA).

## 2011-11-20 NOTE — ED Notes (Signed)
Per EMS: pt began having left sided CP tonight while watching TV.  Associated with hyperventilation and nausea.  No diaphoresis, vomiting.  Pain was sharp, increasing with movement, and worsens with inspiration.  Also was c/o dizziness.  Pain free at this time.  Numbness in lips, negative stroke scale.

## 2011-11-20 NOTE — ED Notes (Signed)
Pt c/o left sided rib pain that began suddenly this evening while watching TV.  States pain lasted for about 15 minutes and was accompanied with SOB, nausea, and lip numbness.  No pain at this time.

## 2011-11-21 LAB — URINALYSIS, ROUTINE W REFLEX MICROSCOPIC
Ketones, ur: NEGATIVE mg/dL
Leukocytes, UA: NEGATIVE
Nitrite: NEGATIVE
Specific Gravity, Urine: 1.003 — ABNORMAL LOW (ref 1.005–1.030)
pH: 7 (ref 5.0–8.0)

## 2011-11-21 LAB — URINE MICROSCOPIC-ADD ON

## 2011-11-21 MED ORDER — PROMETHAZINE HCL 25 MG PO TABS: 25.0000 mg | ORAL_TABLET | Freq: Four times a day (QID) | ORAL | Status: AC | PRN

## 2011-11-24 ENCOUNTER — Encounter: Payer: Self-pay | Admitting: Internal Medicine

## 2011-11-24 ENCOUNTER — Ambulatory Visit (INDEPENDENT_AMBULATORY_CARE_PROVIDER_SITE_OTHER): Payer: Medicare Other | Admitting: Internal Medicine

## 2011-11-24 VITALS — BP 118/77 | HR 64 | Temp 98.8°F | Ht 66.0 in | Wt 148.0 lb

## 2011-11-24 DIAGNOSIS — B20 Human immunodeficiency virus [HIV] disease: Secondary | ICD-10-CM

## 2011-11-24 DIAGNOSIS — Z113 Encounter for screening for infections with a predominantly sexual mode of transmission: Secondary | ICD-10-CM

## 2011-11-24 DIAGNOSIS — I10 Essential (primary) hypertension: Secondary | ICD-10-CM

## 2011-11-24 DIAGNOSIS — Z79899 Other long term (current) drug therapy: Secondary | ICD-10-CM

## 2011-11-24 DIAGNOSIS — E785 Hyperlipidemia, unspecified: Secondary | ICD-10-CM

## 2011-11-24 NOTE — Progress Notes (Signed)
  Subjective:    Patient ID: Dawn Byrd, female    DOB: June 16, 1956, 55 y.o.   MRN: [id number]  HPI Dawn Byrd is in for her routine visit. She was in the emergency department on November 25 after she developed sudden onset of of left upper quadrant and left-sided chest pain associated with nausea. The pain did not radiate and came on at rest. She did not have any vomiting or diarrhea. She was not febrile. She did not have any cough or shortness of breath. Her blood pressure was 140/100 in the emergency room. She was treated with Phenergan and her pain and nausea resolved. She has not had any further problems since leaving the emergency department.  She denies missing any doses of her medications since her last visit. She states that she currently does not have any desire to quit smoking cigarettes.    Review of Systems     Objective:   Physical Exam  Constitutional: No distress.  HENT:  Mouth/Throat: Oropharynx is clear and moist. No oropharyngeal exudate.  Cardiovascular: Normal rate, regular rhythm and normal heart sounds.   No murmur heard. Pulmonary/Chest: Breath sounds normal. She has no wheezes. She has no rales.  Abdominal: Soft. Bowel sounds are normal. She exhibits no distension. There is no tenderness.  Psychiatric: She has a normal mood and affect.   HIV 1 RNA Quant (copies/mL)  Date Value  11/10/2011 NOT DETECTED   06/28/2011 <20   03/29/2011 <20      CD4 T Cell Abs (cmm)  Date Value  11/10/2011 860   06/28/2011 850   03/29/2011 790             Assessment & Plan:

## 2011-11-24 NOTE — Assessment & Plan Note (Signed)
Her infection remains under excellent control. I will continue her current regimen. 

## 2011-11-24 NOTE — Assessment & Plan Note (Signed)
Her LDL remains elevated at 131. I talked to her about dietary modification and counseled her about cigarette cessation.

## 2011-11-24 NOTE — Assessment & Plan Note (Signed)
I suspect her hypertension while in the emergency department was related to her pain. She denies missing any doses of her atenolol and her blood pressure is back down in goal range today.

## 2011-12-13 ENCOUNTER — Other Ambulatory Visit: Payer: Self-pay | Admitting: *Deleted

## 2011-12-13 DIAGNOSIS — R52 Pain, unspecified: Secondary | ICD-10-CM

## 2011-12-13 MED ORDER — OXYCODONE-ACETAMINOPHEN 10-325 MG PO TABS: 1.0000 | ORAL_TABLET | Freq: Four times a day (QID) | ORAL | Status: DC | PRN

## 2011-12-13 NOTE — Telephone Encounter (Signed)
She will pick it up Thursday pm

## 2011-12-15 ENCOUNTER — Other Ambulatory Visit: Payer: Self-pay | Admitting: *Deleted

## 2011-12-15 NOTE — Telephone Encounter (Signed)
I let her know that the rx was ready to be picked up. States she will be here shortly

## 2012-01-04 ENCOUNTER — Telehealth: Payer: Self-pay | Admitting: *Deleted

## 2012-01-04 NOTE — Telephone Encounter (Signed)
Patient came by the clinic because her insurance changed and she can no longer afford her co-pay on the Truvada and Isentress. We were aboe to find her co-pay cards for both and advised her to always call with any problem with the medication not to just stop taking them we may be able to help. In this case we were. Explained to call the 800# on the card to activate it then take it to the pharmacy when she picks up her meds.

## 2012-01-12 ENCOUNTER — Other Ambulatory Visit: Payer: Self-pay | Admitting: *Deleted

## 2012-01-12 DIAGNOSIS — R52 Pain, unspecified: Secondary | ICD-10-CM

## 2012-01-12 MED ORDER — OXYCODONE-ACETAMINOPHEN 10-325 MG PO TABS: 1.0000 | ORAL_TABLET | Freq: Four times a day (QID) | ORAL | Status: DC | PRN

## 2012-01-12 NOTE — Telephone Encounter (Signed)
She left a message asking for a refill on her percocet. I asked another md to sign for it. It is due tomorrow. I called her back & asked that she call before she comes tomorrow to make sure it has been signed before she comes here

## 2012-02-14 ENCOUNTER — Other Ambulatory Visit: Payer: Self-pay | Admitting: *Deleted

## 2012-02-14 ENCOUNTER — Other Ambulatory Visit: Payer: Self-pay | Admitting: Licensed Clinical Social Worker

## 2012-02-14 DIAGNOSIS — Z139 Encounter for screening, unspecified: Secondary | ICD-10-CM

## 2012-02-14 DIAGNOSIS — Z79899 Other long term (current) drug therapy: Secondary | ICD-10-CM

## 2012-02-14 DIAGNOSIS — R52 Pain, unspecified: Secondary | ICD-10-CM

## 2012-02-14 MED ORDER — OXYCODONE-ACETAMINOPHEN 10-325 MG PO TABS: 1.0000 | ORAL_TABLET | Freq: Four times a day (QID) | ORAL | Status: DC | PRN

## 2012-02-16 ENCOUNTER — Other Ambulatory Visit: Payer: Medicare Other

## 2012-02-16 DIAGNOSIS — Z139 Encounter for screening, unspecified: Secondary | ICD-10-CM

## 2012-02-16 LAB — DRUG SCREEN, URINE
Cocaine Metabolites: NEGATIVE
Marijuana Metabolite: NEGATIVE
Opiates: NEGATIVE
Phencyclidine (PCP): NEGATIVE

## 2012-02-22 LAB — OPIATE, QUANTITATIVE, URINE: Codeine Urine: NEGATIVE NG/ML

## 2012-03-06 ENCOUNTER — Encounter: Payer: Self-pay | Admitting: Cardiovascular Disease

## 2012-03-06 ENCOUNTER — Emergency Department: Payer: Self-pay | Admitting: Emergency Medicine

## 2012-03-06 ENCOUNTER — Telehealth: Payer: Self-pay | Admitting: *Deleted

## 2012-03-06 LAB — CBC
HCT: 42.2 % (ref 35.0–47.0)
HGB: 14.1 g/dL (ref 12.0–16.0)
MCH: 31.6 pg (ref 26.0–34.0)
MCHC: 33.4 g/dL (ref 32.0–36.0)
Platelet: 180 10*3/uL (ref 150–440)
RDW: 13.1 % (ref 11.5–14.5)
WBC: 4.8 10*3/uL (ref 3.6–11.0)

## 2012-03-06 LAB — COMPREHENSIVE METABOLIC PANEL
Albumin: 4 g/dL (ref 3.4–5.0)
Anion Gap: 10 (ref 7–16)
BUN: 10 mg/dL (ref 7–18)
Bilirubin,Total: 0.3 mg/dL (ref 0.2–1.0)
Chloride: 106 mmol/L (ref 98–107)
Co2: 25 mmol/L (ref 21–32)
Creatinine: 0.78 mg/dL (ref 0.60–1.30)
EGFR (African American): >60
EGFR (Non-African Amer.): >60
Glucose: 98 mg/dL (ref 65–99)
Potassium: 3.6 mmol/L (ref 3.5–5.1)
SGPT (ALT): 29 U/L
Sodium: 141 mmol/L (ref 136–145)
Total Protein: 7.3 g/dL (ref 6.4–8.2)

## 2012-03-06 LAB — TROPONIN I: Troponin-I: <0.02 ng/mL

## 2012-03-06 LAB — CK TOTAL AND CKMB (NOT AT ARMC)
CK, Total: 64 U/L (ref 21–215)
CK-MB: <0.5 ng/mL — ABNORMAL LOW (ref 0.5–3.6)

## 2012-03-06 NOTE — Telephone Encounter (Signed)
Patient called c/o chest pain, radiating to her arm and back x 2 days. Patient advised to go to the ED ASAP,  She agreed to go. Wendall Mola CMA

## 2012-03-13 ENCOUNTER — Other Ambulatory Visit: Payer: Self-pay | Admitting: *Deleted

## 2012-03-13 DIAGNOSIS — R52 Pain, unspecified: Secondary | ICD-10-CM

## 2012-03-13 MED ORDER — OXYCODONE-ACETAMINOPHEN 10-325 MG PO TABS: 1.0000 | ORAL_TABLET | Freq: Four times a day (QID) | ORAL | Status: DC | PRN

## 2012-03-15 ENCOUNTER — Other Ambulatory Visit: Payer: Medicare Other

## 2012-03-15 DIAGNOSIS — Z79899 Other long term (current) drug therapy: Secondary | ICD-10-CM

## 2012-03-15 DIAGNOSIS — Z113 Encounter for screening for infections with a predominantly sexual mode of transmission: Secondary | ICD-10-CM

## 2012-03-15 DIAGNOSIS — B20 Human immunodeficiency virus [HIV] disease: Secondary | ICD-10-CM

## 2012-03-15 LAB — LIPID PANEL
Cholesterol: 190 mg/dL (ref 0–200)
HDL: 39 mg/dL — ABNORMAL LOW (ref >39)
LDL Cholesterol: 119 mg/dL — ABNORMAL HIGH (ref 0–99)
Triglycerides: 158 mg/dL — ABNORMAL HIGH (ref <150)
VLDL: 32 mg/dL (ref 0–40)

## 2012-03-15 LAB — COMPLETE METABOLIC PANEL WITH GFR
ALT: 17 U/L (ref 0–35)
AST: 21 U/L (ref 0–37)
BUN: 11 mg/dL (ref 6–23)
CO2: 28 mEq/L (ref 19–32)
Creat: 0.83 mg/dL (ref 0.50–1.10)
GFR, Est African American: >89 mL/min
Total Bilirubin: 0.5 mg/dL (ref 0.3–1.2)

## 2012-03-15 LAB — CBC
Hemoglobin: 14.5 g/dL (ref 12.0–15.0)
MCH: 31.8 pg (ref 26.0–34.0)
Platelets: 177 10*3/uL (ref 150–400)
RBC: 4.56 MIL/uL (ref 3.87–5.11)
WBC: 5.3 10*3/uL (ref 4.0–10.5)

## 2012-03-16 LAB — T-HELPER CELL (CD4) - (RCID CLINIC ONLY)
CD4 % Helper T Cell: 30 % — ABNORMAL LOW (ref 33–55)
CD4 T Cell Abs: 850 uL (ref 400–2700)

## 2012-03-19 LAB — HIV-1 RNA QUANT-NO REFLEX-BLD: HIV 1 RNA Quant: <20 copies/mL (ref <20)

## 2012-03-26 ENCOUNTER — Encounter: Payer: Self-pay | Admitting: *Deleted

## 2012-03-26 ENCOUNTER — Ambulatory Visit (INDEPENDENT_AMBULATORY_CARE_PROVIDER_SITE_OTHER): Payer: Medicare Other | Admitting: Cardiovascular Disease

## 2012-03-26 ENCOUNTER — Encounter: Payer: Self-pay | Admitting: Cardiovascular Disease

## 2012-03-26 DIAGNOSIS — F17200 Nicotine dependence, unspecified, uncomplicated: Secondary | ICD-10-CM

## 2012-03-26 DIAGNOSIS — R079 Chest pain, unspecified: Secondary | ICD-10-CM | POA: Insufficient documentation

## 2012-03-26 DIAGNOSIS — E785 Hyperlipidemia, unspecified: Secondary | ICD-10-CM

## 2012-03-26 DIAGNOSIS — I10 Essential (primary) hypertension: Secondary | ICD-10-CM

## 2012-03-26 MED ORDER — ATORVASTATIN CALCIUM 10 MG PO TABS: 10.0000 mg | ORAL_TABLET | Freq: Every day | ORAL | Status: DC

## 2012-03-26 NOTE — Assessment & Plan Note (Signed)
We have encouraged her to continue to work on weaning her cigarettes and smoking cessation. She will continue to work on this. She will discuss starting Chantix with her primary care physician.

## 2012-03-26 NOTE — Assessment & Plan Note (Signed)
She reports having chest pain at rest. Will order a treadmill study as she does have some risk factors for coronary artery disease. She has hyperlipidemia, long smoking history, underlying HIV.

## 2012-03-26 NOTE — Patient Instructions (Addendum)
You are doing well. Please start aspirin 81 mg daily Start lipitor 10 mg daily  We will schedule you for a treadmill study for chest pain Do not take atenolol the night before the treadmill  Please call us if you have new issues that need to be addressed before your next appt.  Your physician wants you to follow-up in: 6 months.  You will receive a reminder letter in the mail two months in advance. If you don't receive a letter, please call our office to schedule the follow-up appointment.

## 2012-03-26 NOTE — Assessment & Plan Note (Signed)
Blood pressure is well controlled on today's visit. No changes made to the medications. 

## 2012-03-26 NOTE — Progress Notes (Signed)
Patient ID: Dawn Byrd, female    DOB: [birth year], 56 y.o.   MRN: [id number]  HPI Comments: Dawn Byrd is a pleasant 56 year old with history of HIV, long history of smoking for 40 years since age 56, hyperlipidemia who presents for evaluation of recent episodes of chest pain.  She reports that symptoms started this year, typically come on at rest lasting for several seconds at a time. She has had more than 10 episodes. Typically they have been more she is sitting on the couch. She has not had problems or symptoms with exertion. She does not do regular exercise program but she does do her ADLs and go shopping.  She went to the emergency room at the end of last year with workup essentially normal.  She had symptoms again recently in the emergency room March 06 2012. EKG and lab work was normal. She was discharged home with followup.  She continues to smoke one pack per day  EKG today shows normal sinus rhythm with rate 64 beats per minute with no significant ST or T wave changes  Outpatient Encounter Prescriptions as of 03/26/2012  Medication Sig Dispense Refill  . atenolol (TENORMIN) 50 MG tablet Take 50 mg by mouth daily.        . citalopram (CELEXA) 20 MG tablet Take 20 mg by mouth daily.        Marland Kitchen emtricitabine-tenofovir (TRUVADA) 200-300 MG per tablet Take 1 tablet by mouth daily.        . folic acid (FOLVITE) 400 MCG tablet Take 400 mcg by mouth daily.        Marland Kitchen oxyCODONE-acetaminophen (PERCOCET) 10-325 MG per tablet Take 1 tablet by mouth every 6 (six) hours as needed. For pain  120 tablet  0  . raltegravir (ISENTRESS) 400 MG tablet Take 400 mg by mouth. Take one tab every 12 hours         Review of Systems  Constitutional: Negative.   HENT: Negative.   Eyes: Negative.   Respiratory: Negative.   Cardiovascular: Positive for chest pain.  Gastrointestinal: Negative.   Musculoskeletal: Negative.   Skin: Negative.   Neurological: Negative.   Hematological: Negative.     Psychiatric/Behavioral: Negative.   All other systems reviewed and are negative.    BP 130/66  Pulse 64  Ht 5\' 6"  (1.676 m)  Wt 149 lb 12.8 oz (67.949 kg)  BMI 24.18 kg/m2  Physical Exam  Nursing note and vitals reviewed. Constitutional: She is oriented to person, place, and time. She appears well-developed and well-nourished.  HENT:  Head: Normocephalic.  Nose: Nose normal.  Mouth/Throat: Oropharynx is clear and moist.  Eyes: Conjunctivae are normal. Pupils are equal, round, and reactive to light.  Neck: Normal range of motion. Neck supple. No JVD present.  Cardiovascular: Normal rate, regular rhythm, S1 normal, S2 normal, normal heart sounds and intact distal pulses.  Exam reveals no gallop and no friction rub.   No murmur heard. Pulmonary/Chest: Effort normal and breath sounds normal. No respiratory distress. She has no wheezes. She has no rales. She exhibits no tenderness.  Abdominal: Soft. Bowel sounds are normal. She exhibits no distension. There is no tenderness.  Musculoskeletal: Normal range of motion. She exhibits no edema and no tenderness.  Lymphadenopathy:    She has no cervical adenopathy.  Neurological: She is alert and oriented to person, place, and time. Coordination normal.  Skin: Skin is warm and dry. No rash noted. No erythema.  Psychiatric: She has a normal  mood and affect. Her behavior is normal. Judgment and thought content normal.         Assessment and Plan

## 2012-03-26 NOTE — Assessment & Plan Note (Signed)
She is high risk of coronary artery disease. We have suggested she try to get her cholesterol lower. We have recommended she start Lipitor 10 mg daily.

## 2012-03-29 ENCOUNTER — Ambulatory Visit: Payer: Medicare Other | Admitting: Adult Health

## 2012-03-29 ENCOUNTER — Encounter: Payer: Self-pay | Admitting: Internal Medicine

## 2012-03-29 ENCOUNTER — Ambulatory Visit (INDEPENDENT_AMBULATORY_CARE_PROVIDER_SITE_OTHER): Payer: Medicare Other | Admitting: Internal Medicine

## 2012-03-29 VITALS — BP 123/77 | HR 67 | Temp 98.5°F | Ht 66.0 in | Wt 149.5 lb

## 2012-03-29 DIAGNOSIS — B20 Human immunodeficiency virus [HIV] disease: Secondary | ICD-10-CM

## 2012-03-29 DIAGNOSIS — E785 Hyperlipidemia, unspecified: Secondary | ICD-10-CM

## 2012-03-29 DIAGNOSIS — Z79899 Other long term (current) drug therapy: Secondary | ICD-10-CM

## 2012-03-29 DIAGNOSIS — F17200 Nicotine dependence, unspecified, uncomplicated: Secondary | ICD-10-CM

## 2012-03-29 MED ORDER — CITALOPRAM HYDROBROMIDE 20 MG PO TABS: 40.0000 mg | ORAL_TABLET | Freq: Every day | ORAL | Status: DC

## 2012-03-29 MED ORDER — VARENICLINE TARTRATE 0.5 MG PO TABS: 0.5000 mg | ORAL_TABLET | Freq: Two times a day (BID) | ORAL | Status: AC

## 2012-03-29 NOTE — Progress Notes (Signed)
Patient ID: Dawn Byrd, female   DOB: 11/25/1956, 56 y.o.   MRN: [id number]  INFECTIOUS DISEASE PROGRESS NOTE    Subjective: Dawn Byrd is in for her routine followup. She never misses a single dose of her HIV medications. She is continued to have some intermittent chest pain often at rest and is scheduled for a stress test. Her physician suggested that she start taking Lipitor but she wanted to discuss this before making that decision. She states that she is much more motivated to quit smoking cigarettes. However she does not have a current plan to quit.  She was started on Celexa several years ago because of obsessive thoughts. Initially it seemed to help very well but recently she has noted some increase in obsessive thoughts. When asked to give an example she will state that it's nothing scary but may be as simple as a song that she cannot get out of her head. She does find it to be very distracting.  Objective: Temp: 98.5 F (36.9 C) (04/04 1452) Temp src: Oral (04/04 1452) BP: 123/77 mmHg (04/04 1452) Pulse Rate: 67  (04/04 1452)  General: She is in no distress. She is in good spirits Skin: No rash Lungs: Clear Cor: Regular S1 and S2 and no murmurs She is alert and oriented with normal speech and thought pattern. She does not appear to be depressed  Lab Results HIV 1 RNA Quant (copies/mL)  Date Value  03/15/2012 <20   11/10/2011 NOT DETECTED   06/28/2011 <20      CD4 T Cell Abs (cmm)  Date Value  03/15/2012 850   11/10/2011 860   06/28/2011 850      Assessment: Her HIV infection remains under excellent control. I will continue her current regimen.  I agree with starting Lipitor.  I talked to her at length about his cigarette cessation plan have given her a prescription for Chantix and written information about the [place] quit line.  I suggested that we increase the dose of her Celexa and monitor her obsessive thoughts.   Plan: 1. Continue current antiretroviral  regimen 2. Start Lipitor 3. Call [facility] quit line and start Chantix 4. Increase Celexa to 40 mg daily 5. Return to clinic after lab work in 6 months   Cliffton Asters, MD [facility] [phone] pager   [phone] cell 03/29/2012, 3:41 PM

## 2012-04-11 ENCOUNTER — Ambulatory Visit (INDEPENDENT_AMBULATORY_CARE_PROVIDER_SITE_OTHER): Payer: Medicare Other | Admitting: Cardiovascular Disease

## 2012-04-11 DIAGNOSIS — R079 Chest pain, unspecified: Secondary | ICD-10-CM

## 2012-04-11 NOTE — Progress Notes (Signed)
Exercise Treadmill Test  Treadmill ordered for recent epsiodes of chest pain.  Resting EKG shows NSR with rate of 69 bpm, no significant ST or T wave changes Resting blood pressure of 132/74. Stand bruce protocal was used.  Patient exercised for 8 min 00 sec,  Peak heart rate of 154 bpm.  This was 105% of the maximum predicted heart rate (target heart rate 140). Achieved 10.1 METS No symptoms of chest pain or lightheadedness were reported at peak stress or in recovery.  Peak Blood pressure recorded was 170/80. Heart rate at 3 minutes in recovery was 90 bpm. No significant ST changes concerning for ischemia (less than 1 mm)   FINAL IMPRESSION: Normal exercise stress test. No significant EKG changes concerning for ischemia. Good exercise tolerance.

## 2012-04-11 NOTE — Patient Instructions (Signed)
Normal stress test. Follow up in one year. Please call the office if you have any new cardiac issues, increasing SOB or chest pain

## 2012-04-12 ENCOUNTER — Other Ambulatory Visit: Payer: Self-pay | Admitting: Licensed Clinical Social Worker

## 2012-04-12 DIAGNOSIS — B20 Human immunodeficiency virus [HIV] disease: Secondary | ICD-10-CM

## 2012-04-12 MED ORDER — RALTEGRAVIR POTASSIUM 400 MG PO TABS: 400.0000 mg | ORAL_TABLET | Freq: Two times a day (BID) | ORAL | Status: DC

## 2012-04-13 ENCOUNTER — Other Ambulatory Visit: Payer: Self-pay | Admitting: *Deleted

## 2012-04-13 DIAGNOSIS — R52 Pain, unspecified: Secondary | ICD-10-CM

## 2012-04-13 MED ORDER — OXYCODONE-ACETAMINOPHEN 10-325 MG PO TABS: 1.0000 | ORAL_TABLET | Freq: Four times a day (QID) | ORAL | Status: DC | PRN

## 2012-05-14 ENCOUNTER — Telehealth: Payer: Self-pay | Admitting: *Deleted

## 2012-05-14 NOTE — Telephone Encounter (Signed)
Patient is under long term pain contract for Percocet 10-325 # 120.  She is now assigned to Dr. Orvan Falconer, last filled 04/17/12. Note sent to MD for authorization. Wendall Mola CMA

## 2012-05-15 ENCOUNTER — Other Ambulatory Visit: Payer: Self-pay | Admitting: *Deleted

## 2012-05-15 DIAGNOSIS — R52 Pain, unspecified: Secondary | ICD-10-CM

## 2012-05-15 MED ORDER — OXYCODONE-ACETAMINOPHEN 10-325 MG PO TABS: 1.0000 | ORAL_TABLET | Freq: Four times a day (QID) | ORAL | Status: DC | PRN

## 2012-05-15 NOTE — Telephone Encounter (Signed)
She may have a one-month refill of her Percocet. I note that she has Medicare insurance so please facilitate getting her a primary care physician locally.

## 2012-05-15 NOTE — Telephone Encounter (Signed)
Explained to patient, and she asked for suggestions on primary care, advised her to try [facility]. Wendall Mola CMA

## 2012-07-09 ENCOUNTER — Other Ambulatory Visit: Payer: Self-pay | Admitting: *Deleted

## 2012-07-09 DIAGNOSIS — I10 Essential (primary) hypertension: Secondary | ICD-10-CM

## 2012-07-09 MED ORDER — ATENOLOL 50 MG PO TABS: 50.0000 mg | ORAL_TABLET | Freq: Every day | ORAL | Status: DC

## 2012-08-03 ENCOUNTER — Other Ambulatory Visit: Payer: Self-pay | Admitting: *Deleted

## 2012-08-03 DIAGNOSIS — B20 Human immunodeficiency virus [HIV] disease: Secondary | ICD-10-CM

## 2012-08-03 MED ORDER — EMTRICITABINE-TENOFOVIR DF 200-300 MG PO TABS: 1.0000 | ORAL_TABLET | Freq: Every day | ORAL | Status: DC

## 2012-08-30 IMAGING — CR DG CHEST 2V
2 series · 2 of 2 positions shown · non-contrast
Comparison: Chest radiograph performed 03/26/2007

CLINICAL DATA: Chest pain and shortness of breath; history of
smoking.

CHEST - 2 VIEW

[w chest pa]
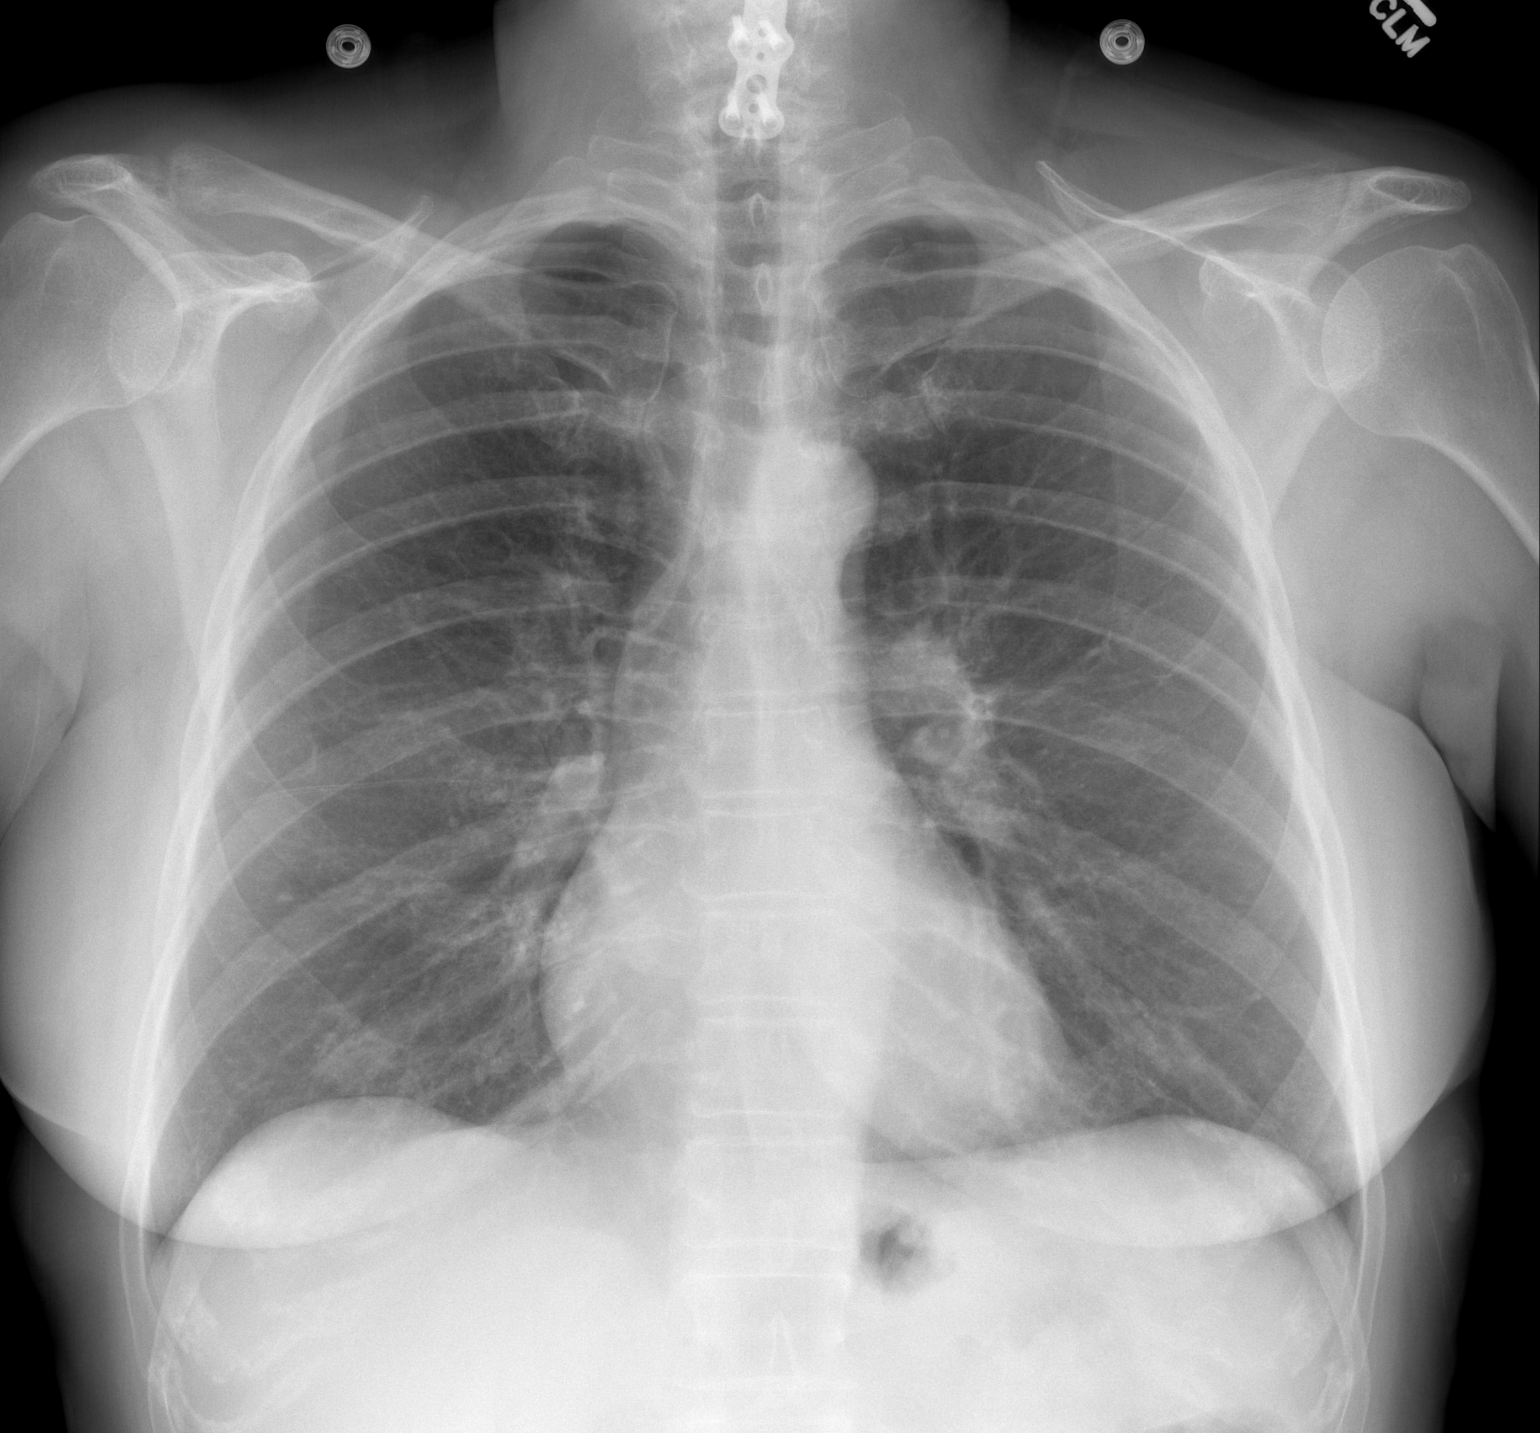

[w chest lat]
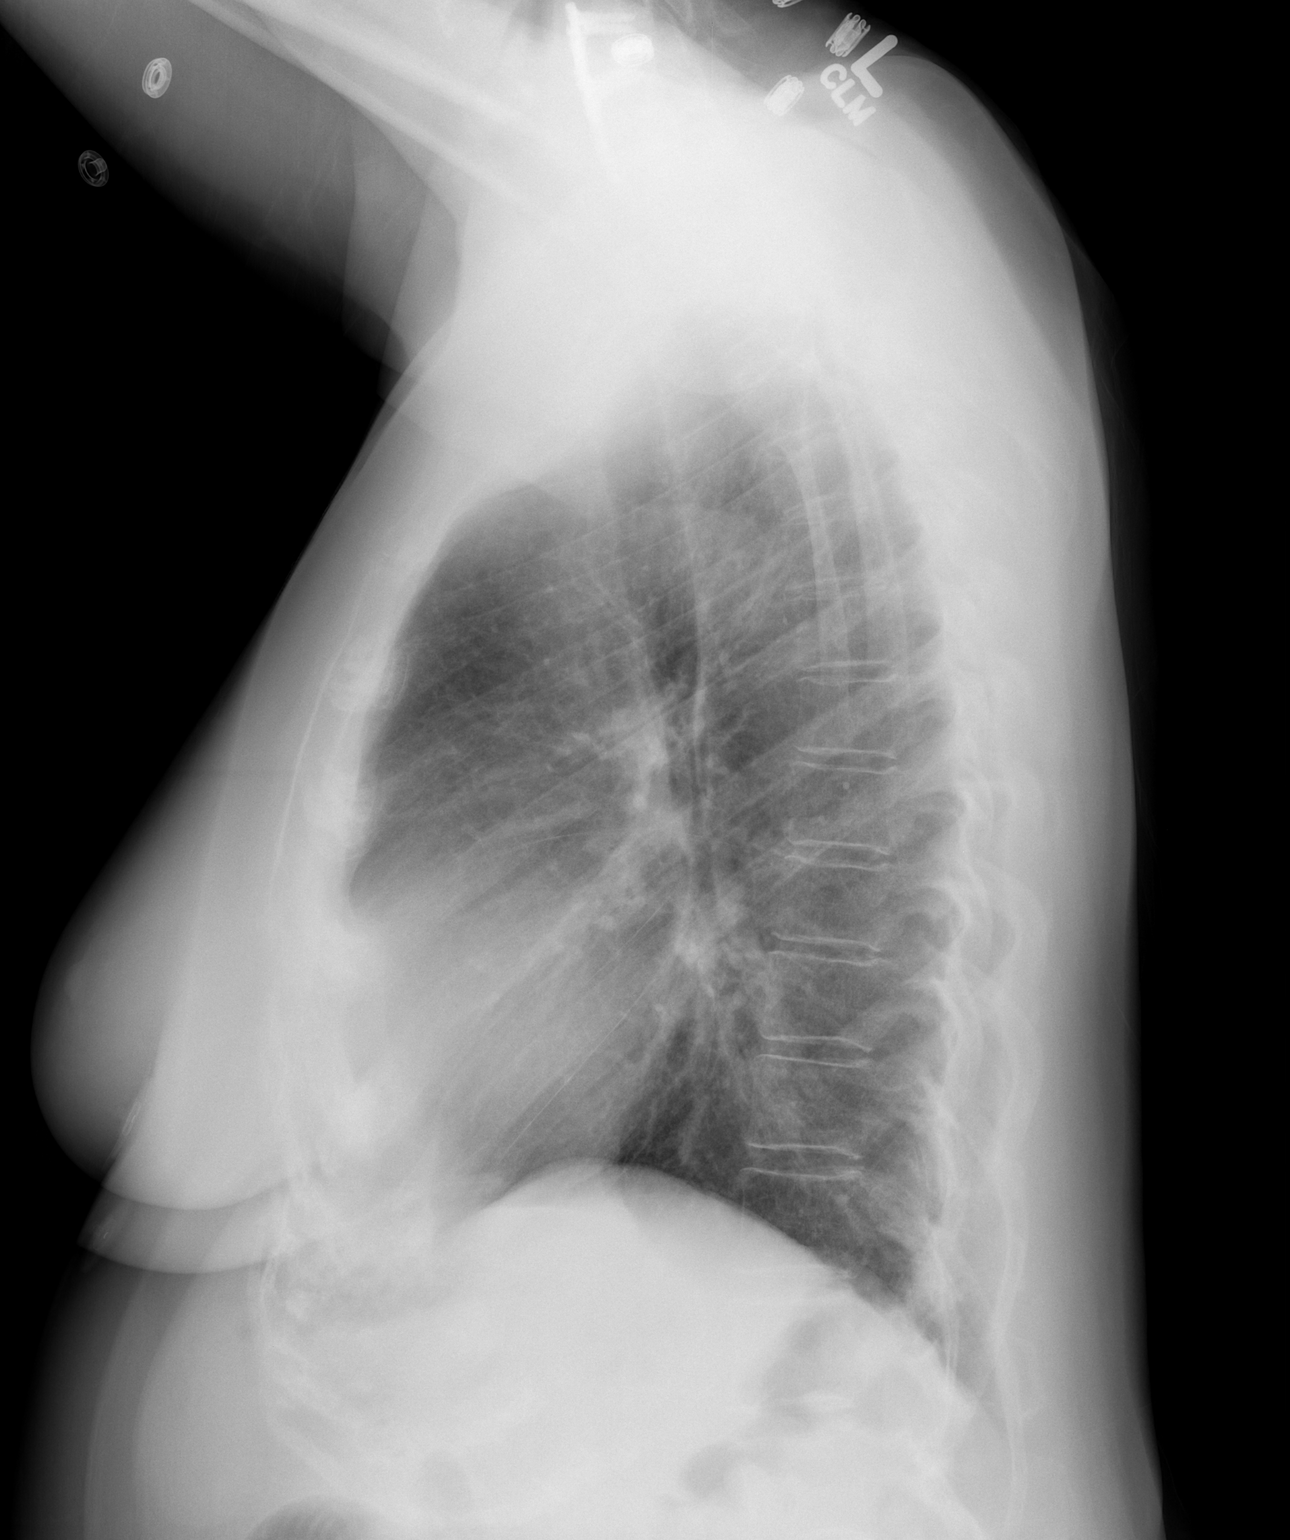

[2 of 2 positions shown; findings below may reference images not displayed]

FINDINGS: The lungs are well-aerated.  Minimal bibasilar scarring
is noted.  There is no evidence of focal opacification, pleural
effusion or pneumothorax.  A likely small stable calcified
granuloma is noted at the right lung base.

The heart is normal in size; the mediastinal contour is within
normal limits.  No acute osseous abnormalities are seen.  Cervical
spinal fusion hardware is partially imaged.
IMPRESSION: No acute cardiopulmonary process seen.

## 2012-10-03 ENCOUNTER — Ambulatory Visit (INDEPENDENT_AMBULATORY_CARE_PROVIDER_SITE_OTHER): Payer: Medicare Other

## 2012-10-03 DIAGNOSIS — Z23 Encounter for immunization: Secondary | ICD-10-CM

## 2012-11-01 ENCOUNTER — Other Ambulatory Visit: Payer: Medicare Other

## 2012-11-01 DIAGNOSIS — Z79899 Other long term (current) drug therapy: Secondary | ICD-10-CM

## 2012-11-01 DIAGNOSIS — B20 Human immunodeficiency virus [HIV] disease: Secondary | ICD-10-CM

## 2012-11-01 LAB — LIPID PANEL
LDL Cholesterol: 77 mg/dL (ref 0–99)
Triglycerides: 124 mg/dL (ref <150)
VLDL: 25 mg/dL (ref 0–40)

## 2012-11-02 LAB — T-HELPER CELL (CD4) - (RCID CLINIC ONLY)
CD4 % Helper T Cell: 33 % (ref 33–55)
CD4 T Cell Abs: 860 uL (ref 400–2700)

## 2012-11-02 LAB — HIV-1 RNA QUANT-NO REFLEX-BLD: HIV 1 RNA Quant: <20 copies/mL (ref <20)

## 2012-11-13 ENCOUNTER — Ambulatory Visit (INDEPENDENT_AMBULATORY_CARE_PROVIDER_SITE_OTHER): Payer: Medicare Other | Admitting: Internal Medicine

## 2012-11-13 ENCOUNTER — Encounter: Payer: Self-pay | Admitting: Internal Medicine

## 2012-11-13 VITALS — BP 144/79 | HR 71 | Temp 98.2°F | Ht 66.0 in | Wt 152.8 lb

## 2012-11-13 DIAGNOSIS — Z0000 Encounter for general adult medical examination without abnormal findings: Secondary | ICD-10-CM

## 2012-11-13 DIAGNOSIS — Z79899 Other long term (current) drug therapy: Secondary | ICD-10-CM

## 2012-11-13 DIAGNOSIS — B20 Human immunodeficiency virus [HIV] disease: Secondary | ICD-10-CM

## 2012-11-13 DIAGNOSIS — Z113 Encounter for screening for infections with a predominantly sexual mode of transmission: Secondary | ICD-10-CM

## 2012-11-13 NOTE — Addendum Note (Signed)
Addended by: Jennet Maduro D on: 11/13/2012 10:51 AM   Modules accepted: Orders

## 2012-11-13 NOTE — Progress Notes (Signed)
Patient ID: Dawn Byrd, female   DOB: 22-Apr-1956, 55 y.o.   MRN: [id number]     [facility] for Infectious Disease  Patient Active Problem List  Diagnosis  . HIV DISEASE  . DISORDERS, OBSESSIVE-COMPULSIVE  . TOBACCO USER  . DEPRESSION, MILD  . Essential hypertension, benign  . PNEUMOCOCCAL PNEUMONIA  . Degenerative disc disease  . Chronic nausea  . Insomnia  . Chest pain  . Dyslipidemia    Patient's Medications  New Prescriptions   No medications on file  Previous Medications   ATENOLOL (TENORMIN) 50 MG TABLET    Take 1 tablet (50 mg total) by mouth daily.   ATORVASTATIN (LIPITOR) 10 MG TABLET    Take 1 tablet (10 mg total) by mouth daily.   CITALOPRAM (CELEXA) 20 MG TABLET    Take 2 tablets (40 mg total) by mouth daily.   EMTRICITABINE-TENOFOVIR (TRUVADA) 200-300 MG PER TABLET    Take 1 tablet by mouth daily.   FOLIC ACID (FOLVITE) 400 MCG TABLET    Take 400 mcg by mouth daily.     OXYCODONE-ACETAMINOPHEN (PERCOCET) 10-325 MG PER TABLET    Take 1 tablet by mouth every 6 (six) hours as needed. For pain   POTASSIUM CHLORIDE (KLOR-CON) 20 MEQ PACKET    Take 20 mEq by mouth 2 (two) times daily.   RALTEGRAVIR (ISENTRESS) 400 MG TABLET    Take 1 tablet (400 mg total) by mouth 2 (two) times daily. Take one tab every 12 hours  Modified Medications   No medications on file  Discontinued Medications   No medications on file    Subjective: Dawn Byrd is in for her routine visit. As usual, she never misses a single dose of her Truvada or Isentress. She continues to smoke cigarettes and states that she has no desire to quit currently. She is still bothered by excessive thoughts and does not feel like the citalopram is helping. She has been taking 40 mg daily. She states that she is excessively worries about things. In particular she worries about her daughter who is living in [place] and cannot get the thought out of her head. Sometimes she will get stuck on a particular song. She states  that the excessive thoughts do not interfere with her daily activities and other people are unaware of them but they bother her considerably.  Objective: Temp: 98.2 F (36.8 C) (11/19 0919) Temp src: Oral (11/19 0919) BP: 144/79 mmHg (11/19 0919) Pulse Rate: 71  (11/19 0919)  General: She is in good spirits Oral: No oropharyngeal lesions. Some carious teeth Skin: No rash. A benign appearing mole on her right posterior flank Lungs: Clear Cor: Regular S1-S2 no murmurs Abdomen: Soft and nontender   Lab Results HIV 1 RNA Quant (copies/mL)  Date Value  11/01/2012 <20   03/15/2012 <20   11/10/2011 NOT DETECTED      CD4 T Cell Abs (cmm)  Date Value  11/01/2012 860   03/15/2012 850   11/10/2011 860      Assessment: Her HIV infection remains under excellent control. I will continue her current antiretroviral regimen.  I have encouraged her to consider cigarette cessation.  She was initially reluctant to consider counseling for her obsessive thoughts but agreed to at least one visit. I asked her to call me after that visit.  Plan: 1. Continue current medications 2. Mental health counseling. She will call me after that visit. 3. Consider cigarette cessation 4. I encouraged her to establish primary care 5.  Followup here after blood work in 6 months   Cliffton Asters, MD [facility] for Infectious Disease [facility] [phone] pager   [phone] cell 11/13/2012, 9:36 AM

## 2012-11-20 ENCOUNTER — Ambulatory Visit: Payer: Medicare Other

## 2012-12-04 ENCOUNTER — Ambulatory Visit: Payer: Medicare Other

## 2012-12-04 DIAGNOSIS — F4320 Adjustment disorder, unspecified: Secondary | ICD-10-CM

## 2012-12-04 NOTE — Progress Notes (Addendum)
I met with Dawn Byrd today for the first time and she reported that her only real reason for seeing me is that she gets a thought stuck in her head and can't get rid of it sometimes.  I asked if she considers herself a Chiropractor", but she said no.  I asked about other symptoms associated with Obsessive Compulsive Disorder, but she did not endorse any.  She reports 5-6 hours of sleep per night, but says that she feels rested and does not nap during the day.  Appetite is good, with no weight gain or loss recently.  She ranked her mood as 7-8 on a 10 pt scale, with 10 being good.  She reports no history of trauma or abuse and no anxiety.  She says she had a good childhood, growing up on a farm in [place], [place], with "good [place] values".  She says she reads a lot - fiction and non-fiction and also enjoys quilting.  She has a grown daughter in [place].  She is divorced, but lives with someone currently.  She has been on Disability since 1995 for a combination of degenerative disc disease, HIV, and some kind of problem with her arm (she did not specify).  I provided some psycho-education on the concept of mindfulness and mindful meditation and facilitated a 2 minute guided meditation, which she seemed to enjoy.  The purpose of this is to help her learn to let go of thoughts.  She agreed to give this a try.  I also recommended a couple of books on the subject by Thich Nhat Hanh and Charlene Brooke.  I gave her my card and encouraged her to come back if she felt she needed to, but told her that if she did not feel she needs to return, it is okay, given that psychiatrically, she seems to be doing well.

## 2012-12-10 ENCOUNTER — Telehealth: Payer: Self-pay | Admitting: *Deleted

## 2012-12-10 NOTE — Telephone Encounter (Signed)
[facility] does not use the "workqueue" to schedule "new" PCP patients.  They prefer for the patient to call them directly to schedule a PCP appointment.  Telephone number for [facility] left on the pt's answering machine with instructions to call for an appointment.  RN requested that the pt call [facility] if she has any problems obtaining an appointment.

## 2013-01-30 ENCOUNTER — Ambulatory Visit: Payer: Medicare Other

## 2013-01-30 ENCOUNTER — Other Ambulatory Visit: Payer: Self-pay | Admitting: *Deleted

## 2013-01-30 DIAGNOSIS — B20 Human immunodeficiency virus [HIV] disease: Secondary | ICD-10-CM

## 2013-01-30 MED ORDER — RALTEGRAVIR POTASSIUM 400 MG PO TABS: 400.0000 mg | ORAL_TABLET | Freq: Two times a day (BID) | ORAL | Status: DC

## 2013-01-30 MED ORDER — EMTRICITABINE-TENOFOVIR DF 200-300 MG PO TABS: 1.0000 | ORAL_TABLET | Freq: Every day | ORAL | Status: DC

## 2013-03-04 ENCOUNTER — Other Ambulatory Visit: Payer: Self-pay | Admitting: *Deleted

## 2013-03-04 DIAGNOSIS — B20 Human immunodeficiency virus [HIV] disease: Secondary | ICD-10-CM

## 2013-03-04 MED ORDER — RALTEGRAVIR POTASSIUM 400 MG PO TABS: 400.0000 mg | ORAL_TABLET | Freq: Two times a day (BID) | ORAL | Status: DC

## 2013-03-04 MED ORDER — EMTRICITABINE-TENOFOVIR DF 200-300 MG PO TABS: 1.0000 | ORAL_TABLET | Freq: Every day | ORAL | Status: DC

## 2013-03-27 ENCOUNTER — Other Ambulatory Visit: Payer: Self-pay | Admitting: Cardiovascular Disease

## 2013-05-08 ENCOUNTER — Other Ambulatory Visit: Payer: Self-pay | Admitting: Internal Medicine

## 2013-05-08 ENCOUNTER — Other Ambulatory Visit: Payer: Self-pay | Admitting: Cardiovascular Disease

## 2013-05-09 ENCOUNTER — Other Ambulatory Visit: Payer: Self-pay | Admitting: *Deleted

## 2013-05-09 MED ORDER — ATORVASTATIN CALCIUM 10 MG PO TABS: ORAL_TABLET | ORAL | Status: DC

## 2013-05-09 NOTE — Telephone Encounter (Signed)
Lmtcb. Pt last seen 03/2012 pt is due for a overdue 6 month f/u she is aware that she needs to contact office to schedule future appointment. Refilled Atorvastatin #30 Refill#1.

## 2013-05-15 ENCOUNTER — Other Ambulatory Visit: Payer: Medicare Other

## 2013-05-15 ENCOUNTER — Other Ambulatory Visit: Payer: Self-pay | Admitting: Infectious Disease

## 2013-05-15 DIAGNOSIS — Z79899 Other long term (current) drug therapy: Secondary | ICD-10-CM

## 2013-05-15 DIAGNOSIS — B20 Human immunodeficiency virus [HIV] disease: Secondary | ICD-10-CM

## 2013-05-15 LAB — COMPREHENSIVE METABOLIC PANEL
ALT: 20 U/L (ref 0–35)
AST: 21 U/L (ref 0–37)
Albumin: 4.2 g/dL (ref 3.5–5.2)
Alkaline Phosphatase: 65 U/L (ref 39–117)
BUN: 12 mg/dL (ref 6–23)
Chloride: 105 mEq/L (ref 96–112)
Potassium: 4.6 mEq/L (ref 3.5–5.3)
Sodium: 140 mEq/L (ref 135–145)

## 2013-05-15 LAB — LIPID PANEL
HDL: 43 mg/dL (ref >39)
LDL Cholesterol: 104 mg/dL — ABNORMAL HIGH (ref 0–99)

## 2013-05-15 LAB — CBC
HCT: 42.9 % (ref 36.0–46.0)
MCHC: 34.7 g/dL (ref 30.0–36.0)
Platelets: 184 10*3/uL (ref 150–400)
RDW: 13.6 % (ref 11.5–15.5)
WBC: 4.8 10*3/uL (ref 4.0–10.5)

## 2013-05-15 LAB — RPR

## 2013-05-15 NOTE — Addendum Note (Signed)
Addended by: Jennet Maduro D on: 05/15/2013 12:00 PM   Modules accepted: Orders

## 2013-05-16 LAB — T-HELPER CELL (CD4) - (RCID CLINIC ONLY): CD4 T Cell Abs: 800 uL (ref 400–2700)

## 2013-05-16 LAB — HIV-1 RNA QUANT-NO REFLEX-BLD
HIV 1 RNA Quant: <20 copies/mL (ref <20)
HIV-1 RNA Quant, Log: <1.3 {Log} (ref <1.30)

## 2013-05-27 ENCOUNTER — Ambulatory Visit (INDEPENDENT_AMBULATORY_CARE_PROVIDER_SITE_OTHER): Payer: Medicare Other | Admitting: Internal Medicine

## 2013-05-27 VITALS — BP 121/74 | HR 67 | Temp 98.2°F | Resp 16 | Ht 66.0 in | Wt 155.0 lb

## 2013-05-27 DIAGNOSIS — B20 Human immunodeficiency virus [HIV] disease: Secondary | ICD-10-CM

## 2013-05-27 DIAGNOSIS — F429 Obsessive-compulsive disorder, unspecified: Secondary | ICD-10-CM

## 2013-05-27 MED ORDER — ATORVASTATIN CALCIUM 10 MG PO TABS: ORAL_TABLET | ORAL | Status: DC

## 2013-05-27 MED ORDER — CITALOPRAM HYDROBROMIDE 40 MG PO TABS: 40.0000 mg | ORAL_TABLET | Freq: Every day | ORAL | Status: DC

## 2013-05-27 NOTE — Progress Notes (Signed)
Patient ID: Dawn Byrd, female   DOB: Dec 25, 1956, 57 y.o.   MRN: [id number]          [facility] for Infectious Disease  Patient Active Problem List   Diagnosis Date Noted  . Chest pain 03/26/2012    Priority: High  . HIV DISEASE 10/27/2006    Priority: High  . Dyslipidemia 03/29/2012    Priority: Medium  . Essential hypertension, benign 07/09/2010    Priority: Medium  . TOBACCO USER 03/19/2010    Priority: Medium  . DEPRESSION, MILD 03/19/2010    Priority: Medium  . DISORDERS, OBSESSIVE-COMPULSIVE 10/27/2006    Priority: Medium  . Chronic nausea 07/12/2011  . Insomnia 07/12/2011  . PNEUMOCOCCAL PNEUMONIA 02/12/2007  . Degenerative disc disease 10/27/2006    Patient's Medications  New Prescriptions   CITALOPRAM (CELEXA) 40 MG TABLET    Take 1 tablet (40 mg total) by mouth daily.  Previous Medications   ATENOLOL (TENORMIN) 50 MG TABLET    TAKE 1 TABLET BY MOUTH DAILY .   EMTRICITABINE-TENOFOVIR (TRUVADA) 200-300 MG PER TABLET    Take 1 tablet by mouth daily.   FOLIC ACID (FOLVITE) 400 MCG TABLET    Take 400 mcg by mouth daily.     OXYCODONE-ACETAMINOPHEN (PERCOCET) 10-325 MG PER TABLET    Take 1 tablet by mouth every 6 (six) hours as needed. For pain   POTASSIUM CHLORIDE (KLOR-CON) 20 MEQ PACKET    Take 20 mEq by mouth 2 (two) times daily.   RALTEGRAVIR (ISENTRESS) 400 MG TABLET    Take 1 tablet (400 mg total) by mouth 2 (two) times daily. Take one tab every 12 hours  Modified Medications   Modified Medication Previous Medication   ATORVASTATIN (LIPITOR) 10 MG TABLET atorvastatin (LIPITOR) 10 MG tablet      TAKE 1 TABLET BY MOUTH DAILY .    TAKE 1 TABLET BY MOUTH DAILY .  Discontinued Medications   CITALOPRAM (CELEXA) 20 MG TABLET    TAKE 1 TABLET BY MOUTH DAILY .    Subjective: Dawn Byrd is in for her routine visit. As usual she never misses a single dose of her Truvada or Isentress. She is still smoking cigarettes and has no current plan to quit.  She does not  feel like citalopram helps her obsessive thoughts. She met with our mental health counselor but does not feel like it will help to go back. She recalls being given some medication years ago by Dr. Roxan Hockey that helped for a short period of time.  Review of Systems: Pertinent items are noted in HPI.  Past Medical History  Diagnosis Date  . Hypertension   . HIV (human immunodeficiency virus infection)   . Nerve disorder   . Chest pain, unspecified     History  Substance Use Topics  . Smoking status: Current Every Day Smoker -- 1.00 packs/day for 30 years    Types: Cigarettes  . Smokeless tobacco: Never Used     Comment: Interested in Chantix  . Alcohol Use: 1.0 oz/week    2 drink(s) per week    Family History  Problem Relation Age of Onset  . Other Mother   . Heart attack Father     No Known Allergies  Objective: Temp: 98.2 F (36.8 C) (06/02 1451) Temp src: Oral (06/02 1451) BP: 121/74 mmHg (06/02 1451) Pulse Rate: 67 (06/02 1451)  General:  She is in good spirits Oral: No oropharyngeal lesions Skin: No rash Lungs: Clear Cor: Regular S1 and  S2 no murmurs Abdomen: Nontender Her mood and affect are normal.  Lab Results HIV 1 RNA Quant (copies/mL)  Date Value  05/15/2013 <20   11/01/2012 <20   03/15/2012 <20      CD4 T Cell Abs (cmm)  Date Value  05/15/2013 800   11/01/2012 860   03/15/2012 850      Lab Results  Component Value Date   WBC 4.8 05/15/2013   HGB 14.9 05/15/2013   HCT 42.9 05/15/2013   MCV 89.7 05/15/2013   PLT 184 05/15/2013   BMET    Component Value Date/Time   NA 140 05/15/2013 0948   K 4.6 05/15/2013 0948   CL 105 05/15/2013 0948   CO2 25 05/15/2013 0948   GLUCOSE 90 05/15/2013 0948   BUN 12 05/15/2013 0948   CREATININE 0.78 05/15/2013 0948   CREATININE 0.66 11/20/2011 2220   CALCIUM 9.5 05/15/2013 0948   GFRNONAA >90 11/20/2011 2220   GFRAA >90 11/20/2011 2220   Lab Results  Component Value Date   ALT 20 05/15/2013   AST 21 05/15/2013    ALKPHOS 65 05/15/2013   BILITOT 0.5 05/15/2013   Lab Results  Component Value Date   CHOL 169 05/15/2013   HDL 43 05/15/2013   LDLCALC 104* 05/15/2013   TRIG 111 05/15/2013   CHOLHDL 3.9 05/15/2013   Assessment: Her HIV infection remains under excellent control. I gave her the option of consolidating her antiretroviral therapy to Stribild 1 daily but she prefers to continue her current regimen.  I talked to her again about cigarette cessation counseling.  Her lipids are under better control since starting atorvastatin one year ago.  I will increase the dose of her citalopram to see if she is feeling better.  Plan: 1. Continue current medications with the exception of increasing citalopram to 40 mg daily 2. Encouraged to quit smoking cigarettes 3. Followup after lab work in 6 months   Cliffton Asters, MD [facility] for Infectious Disease [facility] [phone] pager   [phone] cell 05/27/2013, 3:08 PM

## 2013-06-06 ENCOUNTER — Other Ambulatory Visit: Payer: Self-pay | Admitting: Internal Medicine

## 2013-06-06 DIAGNOSIS — I10 Essential (primary) hypertension: Secondary | ICD-10-CM

## 2013-07-04 NOTE — Addendum Note (Signed)
Addended by: Jennet Maduro D on: 07/04/2013 03:18 PM   Modules accepted: Orders

## 2013-07-11 ENCOUNTER — Other Ambulatory Visit: Payer: Self-pay

## 2013-07-11 DIAGNOSIS — Z1231 Encounter for screening mammogram for malignant neoplasm of breast: Secondary | ICD-10-CM

## 2013-08-01 ENCOUNTER — Ambulatory Visit
Admission: RE | Admit: 2013-08-01 | Discharge: 2013-08-01 | Disposition: A | Discharge status: Home / Self Care | Payer: Medicare Other | Source: Ambulatory Visit

## 2013-08-01 ENCOUNTER — Other Ambulatory Visit: Payer: Self-pay | Admitting: Internal Medicine

## 2013-08-01 DIAGNOSIS — Z1231 Encounter for screening mammogram for malignant neoplasm of breast: Secondary | ICD-10-CM

## 2013-08-22 ENCOUNTER — Ambulatory Visit: Payer: Medicare Other

## 2013-09-14 ENCOUNTER — Other Ambulatory Visit: Payer: Self-pay | Admitting: Internal Medicine

## 2013-10-04 ENCOUNTER — Ambulatory Visit: Payer: Medicare Other

## 2013-10-04 DIAGNOSIS — Z23 Encounter for immunization: Secondary | ICD-10-CM

## 2013-10-14 ENCOUNTER — Other Ambulatory Visit: Payer: Self-pay | Admitting: Internal Medicine

## 2013-10-14 DIAGNOSIS — I10 Essential (primary) hypertension: Secondary | ICD-10-CM

## 2013-10-18 ENCOUNTER — Other Ambulatory Visit: Payer: Self-pay | Admitting: Internal Medicine

## 2013-10-18 DIAGNOSIS — B20 Human immunodeficiency virus [HIV] disease: Secondary | ICD-10-CM

## 2014-01-09 ENCOUNTER — Other Ambulatory Visit: Payer: Medicare Other

## 2014-01-09 DIAGNOSIS — B20 Human immunodeficiency virus [HIV] disease: Secondary | ICD-10-CM

## 2014-01-09 LAB — CBC
HCT: 36.3 % (ref 36.0–46.0)
HEMOGLOBIN: 12.6 g/dL (ref 12.0–15.0)
MCH: 31.4 pg (ref 26.0–34.0)
MCHC: 34.7 g/dL (ref 30.0–36.0)
MCV: 90.5 fL (ref 78.0–100.0)
Platelets: 197 10*3/uL (ref 150–400)
RBC: 4.01 MIL/uL (ref 3.87–5.11)
RDW: 13.4 % (ref 11.5–15.5)
WBC: 4.8 10*3/uL (ref 4.0–10.5)

## 2014-01-09 LAB — COMPREHENSIVE METABOLIC PANEL
ALT: 16 U/L (ref 0–35)
AST: 17 U/L (ref 0–37)
Albumin: 4 g/dL (ref 3.5–5.2)
Alkaline Phosphatase: 55 U/L (ref 39–117)
BILIRUBIN TOTAL: 0.3 mg/dL (ref 0.3–1.2)
BUN: 12 mg/dL (ref 6–23)
CO2: 28 meq/L (ref 19–32)
CREATININE: 0.81 mg/dL (ref 0.50–1.10)
Calcium: 9 mg/dL (ref 8.4–10.5)
Chloride: 106 mEq/L (ref 96–112)
GLUCOSE: 80 mg/dL (ref 70–99)
Potassium: 4.4 mEq/L (ref 3.5–5.3)
Sodium: 140 mEq/L (ref 135–145)
Total Protein: 6.5 g/dL (ref 6.0–8.3)

## 2014-01-12 LAB — HIV-1 RNA QUANT-NO REFLEX-BLD
HIV 1 RNA Quant: <20 copies/mL (ref <20)
HIV-1 RNA Quant, Log: <1.3 {Log} (ref <1.30)

## 2014-01-13 LAB — T-HELPER CELL (CD4) - (RCID CLINIC ONLY)
CD4 T CELL HELPER: 35 % (ref 33–55)
CD4 T Cell Abs: 760 /uL (ref 400–2700)

## 2014-01-23 ENCOUNTER — Ambulatory Visit (INDEPENDENT_AMBULATORY_CARE_PROVIDER_SITE_OTHER): Payer: Medicare Other | Admitting: Internal Medicine

## 2014-01-23 ENCOUNTER — Encounter: Payer: Self-pay | Admitting: Internal Medicine

## 2014-01-23 ENCOUNTER — Ambulatory Visit: Payer: Medicare Other

## 2014-01-23 VITALS — BP 138/80 | HR 72 | Temp 97.2°F | Wt 162.0 lb

## 2014-01-23 DIAGNOSIS — B20 Human immunodeficiency virus [HIV] disease: Secondary | ICD-10-CM

## 2014-01-23 NOTE — Progress Notes (Signed)
Patient ID: Dawn Byrd, female   DOB: 06/09/1956, 58 y.o.   MRN: [id number]          [facility]  Patient Active Problem List   Diagnosis Date Noted  . Chest pain 03/26/2012    Priority: High  . HIV DISEASE 10/27/2006    Priority: High  . Dyslipidemia 03/29/2012    Priority: Medium  . Essential hypertension, benign 07/09/2010    Priority: Medium  . TOBACCO USER 03/19/2010    Priority: Medium  . DEPRESSION, MILD 03/19/2010    Priority: Medium  . DISORDERS, OBSESSIVE-COMPULSIVE 10/27/2006    Priority: Medium  . Chronic nausea 07/12/2011  . Insomnia 07/12/2011  . PNEUMOCOCCAL PNEUMONIA 02/12/2007  . Degenerative disc disease 10/27/2006    Patient's Medications  New Prescriptions   No medications on file  Previous Medications   ATENOLOL (TENORMIN) 50 MG TABLET    TAKE 1 TABLET BY MOUTH DAILY   ATORVASTATIN (LIPITOR) 10 MG TABLET    TAKE 1 TABLET BY MOUTH DAILY .   CITALOPRAM (CELEXA) 40 MG TABLET    Take 1 tablet (40 mg total) by mouth daily.   FOLIC ACID (FOLVITE) 400 MCG TABLET    Take 400 mcg by mouth daily.     ISENTRESS 400 MG TABLET    TAKE 1 TABLET BY MOUTH EVERY 12 HOURS   OXYCODONE-ACETAMINOPHEN (PERCOCET) 10-325 MG PER TABLET    Take 1 tablet by mouth every 6 (six) hours as needed. For pain   POTASSIUM CHLORIDE (KLOR-CON) 20 MEQ PACKET    Take 20 mEq by mouth 2 (two) times daily.   TRUVADA 200-300 MG PER TABLET    TAKE 1 TABLET BY MOUTH DAILY  Modified Medications   No medications on file  Discontinued Medications   No medications on file    Subjective: Dawn Byrd is in for her routine visit. She does not recall missing any doses of her Truvada or Isentress since her last visit. She has not changed any of her other medications. She developed bronchitis around the Christmas holidays and was seen at referral clinic. She was treated with an albuterol inhaler and antibiotic and her bronchitis resolved. She is feeling much better. She was able  to quit smoking cigarettes completely last October and feels much better. She is tolerating the higher dose of her Celexa and has noted a marked improvement in her were used and obsessive thoughts.  She's had a previous hysterectomy. She has had 2 procedures for what sounds like cystocele repair. The last was done over 10 years ago by a local urologist. She cannot remember his name. She states that recently she has noted some fullness and discomfort in her vagina that feels like the problem is coming back. She is considering seeing a urologist again.  Review of Systems: Pertinent items are noted in HPI.  Past Medical History  Diagnosis Date  . Hypertension   . HIV (human immunodeficiency virus infection)   . Nerve disorder   . Chest pain, unspecified     History  Substance Use Topics  . Smoking status: Current Every Day Smoker -- 1.00 packs/day for 30 years    Types: Cigarettes  . Smokeless tobacco: Never Used     Comment: Interested in Chantix  . Alcohol Use: 1.0 oz/week    2 drink(s) per week    Family History  Problem Relation Age of Onset  . Other Mother   . Heart attack Father     No  Known Allergies  Objective: Temp: 97.2 F (36.2 C) (01/29 1449) Temp src: Oral (01/29 1449) BP: 138/80 mmHg (01/29 1449) Pulse Rate: 72 (01/29 1449)  Body mass index is 26.16 kg/(m^2).  General: She is in no distress Oral: No oropharyngeal lesions Skin: No rash Lungs: Clear Cor: Regular S1 and S2 no murmurs Abdomen: Nontender Joints and extremities: Normal Neuro: Alert with normal speech and conversation Mood and affect: Normal  Lab Results Lab Results  Component Value Date   WBC 4.8 01/09/2014   HGB 12.6 01/09/2014   HCT 36.3 01/09/2014   MCV 90.5 01/09/2014   PLT 197 01/09/2014    Lab Results  Component Value Date   CREATININE 0.81 01/09/2014   BUN 12 01/09/2014   NA 140 01/09/2014   K 4.4 01/09/2014   CL 106 01/09/2014   CO2 28 01/09/2014    Lab Results  Component Value  Date   ALT 16 01/09/2014   AST 17 01/09/2014   ALKPHOS 55 01/09/2014   BILITOT 0.3 01/09/2014    Lab Results  Component Value Date   CHOL 169 05/15/2013   HDL 43 05/15/2013   LDLCALC 104* 05/15/2013   TRIG 111 05/15/2013   CHOLHDL 3.9 05/15/2013    Lab Results HIV 1 RNA Quant (copies/mL)  Date Value  01/09/2014 <20   05/15/2013 <20   11/01/2012 <20      CD4 T Cell Abs (/uL)  Date Value  01/09/2014 760   05/15/2013 800   11/01/2012 860      Assessment: Her HIV infection remains under excellent control. I gave her the option of simplifying her regimen to once daily Stribild but she prefers to continue her current regimen.  Her blood pressure is at goal.  We'll need to repeat her lipid panel before her next visit.  Celexa has helped with her obsessive thought disorder.  Plan: 1. Continue current medications 2. Follow up after lab work in 6 months 3. Encouraged followup urology evaluation   Cliffton Asters, MD [facility] for Infectious Disease [facility] [phone] pager   [phone] cell 01/23/2014, 3:06 PM

## 2014-01-27 ENCOUNTER — Other Ambulatory Visit: Payer: Self-pay | Admitting: Licensed Clinical Social Worker

## 2014-01-27 DIAGNOSIS — B20 Human immunodeficiency virus [HIV] disease: Secondary | ICD-10-CM

## 2014-01-27 MED ORDER — RALTEGRAVIR POTASSIUM 400 MG PO TABS: 400.0000 mg | ORAL_TABLET | Freq: Two times a day (BID) | ORAL | Status: DC

## 2014-01-27 MED ORDER — EMTRICITABINE-TENOFOVIR DF 200-300 MG PO TABS: 1.0000 | ORAL_TABLET | Freq: Every day | ORAL | Status: DC

## 2014-02-10 ENCOUNTER — Other Ambulatory Visit: Payer: Self-pay | Admitting: *Deleted

## 2014-02-10 DIAGNOSIS — B20 Human immunodeficiency virus [HIV] disease: Secondary | ICD-10-CM

## 2014-02-10 MED ORDER — EMTRICITABINE-TENOFOVIR DF 200-300 MG PO TABS: 1.0000 | ORAL_TABLET | Freq: Every day | ORAL | Status: DC

## 2014-02-10 MED ORDER — RALTEGRAVIR POTASSIUM 400 MG PO TABS: 400.0000 mg | ORAL_TABLET | Freq: Two times a day (BID) | ORAL | Status: DC

## 2014-02-27 ENCOUNTER — Other Ambulatory Visit: Payer: Self-pay | Admitting: Internal Medicine

## 2014-03-25 ENCOUNTER — Other Ambulatory Visit: Payer: Self-pay | Admitting: Internal Medicine

## 2014-05-29 ENCOUNTER — Other Ambulatory Visit: Payer: Self-pay | Admitting: Internal Medicine

## 2014-06-29 ENCOUNTER — Other Ambulatory Visit: Payer: Self-pay | Admitting: Internal Medicine

## 2014-07-31 ENCOUNTER — Other Ambulatory Visit: Payer: Self-pay | Admitting: Internal Medicine

## 2014-08-01 ENCOUNTER — Ambulatory Visit: Payer: Medicare Other

## 2014-08-07 ENCOUNTER — Other Ambulatory Visit: Payer: Self-pay | Admitting: *Deleted

## 2014-08-07 DIAGNOSIS — B20 Human immunodeficiency virus [HIV] disease: Secondary | ICD-10-CM

## 2014-08-07 MED ORDER — EMTRICITABINE-TENOFOVIR DF 200-300 MG PO TABS: 1.0000 | ORAL_TABLET | Freq: Every day | ORAL | Status: DC

## 2014-08-07 MED ORDER — RALTEGRAVIR POTASSIUM 400 MG PO TABS: ORAL_TABLET | ORAL | Status: DC

## 2014-08-07 NOTE — Telephone Encounter (Signed)
ADAP Application 

## 2014-09-01 ENCOUNTER — Other Ambulatory Visit: Payer: Self-pay | Admitting: Internal Medicine

## 2014-09-30 ENCOUNTER — Other Ambulatory Visit: Payer: Self-pay | Admitting: Internal Medicine

## 2014-10-02 ENCOUNTER — Other Ambulatory Visit: Payer: Medicare Other

## 2014-10-02 ENCOUNTER — Ambulatory Visit (INDEPENDENT_AMBULATORY_CARE_PROVIDER_SITE_OTHER): Payer: Medicare Other | Admitting: Licensed Clinical Social Worker

## 2014-10-02 ENCOUNTER — Other Ambulatory Visit: Payer: Self-pay | Admitting: Licensed Clinical Social Worker

## 2014-10-02 DIAGNOSIS — B20 Human immunodeficiency virus [HIV] disease: Secondary | ICD-10-CM

## 2014-10-02 DIAGNOSIS — Z23 Encounter for immunization: Secondary | ICD-10-CM

## 2014-10-02 DIAGNOSIS — Z79899 Other long term (current) drug therapy: Secondary | ICD-10-CM

## 2014-10-02 LAB — COMPREHENSIVE METABOLIC PANEL
ALBUMIN: 4.2 g/dL (ref 3.5–5.2)
ALT: 13 U/L (ref 0–35)
AST: 19 U/L (ref 0–37)
Alkaline Phosphatase: 63 U/L (ref 39–117)
BUN: 12 mg/dL (ref 6–23)
CALCIUM: 9.3 mg/dL (ref 8.4–10.5)
CHLORIDE: 104 meq/L (ref 96–112)
CO2: 27 meq/L (ref 19–32)
Creat: 0.79 mg/dL (ref 0.50–1.10)
GLUCOSE: 89 mg/dL (ref 70–99)
POTASSIUM: 4.5 meq/L (ref 3.5–5.3)
Sodium: 138 mEq/L (ref 135–145)
Total Bilirubin: 0.5 mg/dL (ref 0.2–1.2)
Total Protein: 6.8 g/dL (ref 6.0–8.3)

## 2014-10-02 LAB — CBC
HCT: 41 % (ref 36.0–46.0)
Hemoglobin: 14.2 g/dL (ref 12.0–15.0)
MCH: 31.8 pg (ref 26.0–34.0)
MCHC: 34.6 g/dL (ref 30.0–36.0)
MCV: 91.9 fL (ref 78.0–100.0)
PLATELETS: 202 10*3/uL (ref 150–400)
RBC: 4.46 MIL/uL (ref 3.87–5.11)
RDW: 13.5 % (ref 11.5–15.5)
WBC: 5.3 10*3/uL (ref 4.0–10.5)

## 2014-10-02 LAB — LIPID PANEL
Cholesterol: 148 mg/dL (ref 0–200)
HDL: 40 mg/dL (ref >39)
LDL CALC: 85 mg/dL (ref 0–99)
Total CHOL/HDL Ratio: 3.7 Ratio
Triglycerides: 117 mg/dL (ref <150)
VLDL: 23 mg/dL (ref 0–40)

## 2014-10-02 MED ORDER — RALTEGRAVIR POTASSIUM 400 MG PO TABS: ORAL_TABLET | ORAL | Status: DC

## 2014-10-02 MED ORDER — EMTRICITABINE-TENOFOVIR DF 200-300 MG PO TABS: 1.0000 | ORAL_TABLET | Freq: Every day | ORAL | Status: DC

## 2014-10-03 LAB — T-HELPER CELL (CD4) - (RCID CLINIC ONLY)
CD4 % Helper T Cell: 34 % (ref 33–55)
CD4 T Cell Abs: 970 /uL (ref 400–2700)

## 2014-10-03 LAB — HIV-1 RNA QUANT-NO REFLEX-BLD: HIV-1 RNA Quant, Log: <1.3 {Log} (ref <1.30)

## 2014-10-06 ENCOUNTER — Other Ambulatory Visit: Payer: Self-pay | Admitting: *Deleted

## 2014-10-06 MED ORDER — ATORVASTATIN CALCIUM 10 MG PO TABS: ORAL_TABLET | ORAL | Status: DC

## 2014-10-06 MED ORDER — ATENOLOL 50 MG PO TABS: ORAL_TABLET | ORAL | Status: DC

## 2014-10-06 MED ORDER — CITALOPRAM HYDROBROMIDE 40 MG PO TABS: ORAL_TABLET | ORAL | Status: DC

## 2014-10-13 ENCOUNTER — Telehealth: Payer: Self-pay | Admitting: *Deleted

## 2014-10-13 ENCOUNTER — Ambulatory Visit: Payer: Medicare Other | Admitting: Internal Medicine

## 2014-10-13 NOTE — Telephone Encounter (Signed)
Please make a new appt w/ Dr. Orvan Falconerampbell.

## 2014-11-03 ENCOUNTER — Encounter: Payer: Self-pay | Admitting: Internal Medicine

## 2014-11-03 ENCOUNTER — Ambulatory Visit (INDEPENDENT_AMBULATORY_CARE_PROVIDER_SITE_OTHER): Payer: Medicare Other | Admitting: Internal Medicine

## 2014-11-03 ENCOUNTER — Other Ambulatory Visit: Payer: Self-pay | Admitting: Internal Medicine

## 2014-11-03 ENCOUNTER — Other Ambulatory Visit: Payer: Self-pay | Admitting: Infectious Diseases

## 2014-11-03 VITALS — BP 159/95 | HR 84 | Temp 99.5°F | Wt 152.5 lb

## 2014-11-03 DIAGNOSIS — R0683 Snoring: Secondary | ICD-10-CM

## 2014-11-03 DIAGNOSIS — B20 Human immunodeficiency virus [HIV] disease: Secondary | ICD-10-CM

## 2014-11-03 DIAGNOSIS — N393 Stress incontinence (female) (male): Secondary | ICD-10-CM

## 2014-11-03 MED ORDER — RALTEGRAVIR POTASSIUM 400 MG PO TABS: ORAL_TABLET | ORAL | Status: DC

## 2014-11-03 MED ORDER — EMTRICITABINE-TENOFOVIR DF 200-300 MG PO TABS: 1.0000 | ORAL_TABLET | Freq: Every day | ORAL | Status: DC

## 2014-11-03 NOTE — Addendum Note (Signed)
Addended by: Jennet MaduroESTRIDGE, DENISE D on: 11/03/2014 04:21 PM   Modules accepted: Orders

## 2014-11-03 NOTE — Progress Notes (Signed)
Patient ID: Dawn Byrd, female   DOB: 06/26/1956, 58 y.o.   MRN: [id number]          Patient Active Problem List   Diagnosis Date Noted  . Human immunodeficiency virus (HIV) disease 10/27/2006    Priority: High  . Dyslipidemia 03/29/2012    Priority: Medium  . Essential hypertension, benign 07/09/2010    Priority: Medium  . TOBACCO USER 03/19/2010    Priority: Medium  . DEPRESSION, MILD 03/19/2010    Priority: Medium  . DISORDERS, OBSESSIVE-COMPULSIVE 10/27/2006    Priority: Medium  . Snoring 11/03/2014  . Chest pain 03/26/2012  . Chronic nausea 07/12/2011  . Insomnia 07/12/2011  . PNEUMOCOCCAL PNEUMONIA 02/12/2007  . Degenerative disc disease 10/27/2006    Patient's Medications  New Prescriptions   No medications on file  Previous Medications   ATENOLOL (TENORMIN) 50 MG TABLET    TAKE 1 TABLET BY MOUTH EVERY DAY   ATORVASTATIN (LIPITOR) 10 MG TABLET    TAKE 1 TABLET BY MOUTH EVERY DAY   CITALOPRAM (CELEXA) 40 MG TABLET    TAKE 1 TABLET BY MOUTH EVERY DAY   FOLIC ACID (FOLVITE) 400 MCG TABLET    Take 400 mcg by mouth daily.     OXYCODONE-ACETAMINOPHEN (PERCOCET) 10-325 MG PER TABLET    Take 1 tablet by mouth every 6 (six) hours as needed. For pain   POTASSIUM CHLORIDE (KLOR-CON) 20 MEQ PACKET    Take 20 mEq by mouth 2 (two) times daily.  Modified Medications   Modified Medication Previous Medication   EMTRICITABINE-TENOFOVIR (TRUVADA) 200-300 MG PER TABLET emtricitabine-tenofovir (TRUVADA) 200-300 MG per tablet      Take 1 tablet by mouth daily.    Take 1 tablet by mouth daily.   RALTEGRAVIR (ISENTRESS) 400 MG TABLET raltegravir (ISENTRESS) 400 MG tablet      TAKE 1 TABLET BY MOUTH TWICE DAILY    TAKE 1 TABLET BY MOUTH TWICE DAILY  Discontinued Medications   ISENTRESS 400 MG TABLET    TAKE 1 TABLET BY MOUTH TWICE DAILY    Subjective: Dawn Byrd is in for her routine visit. As usual, she never misses a single dose of her Truvada or Isentress. She feels like the citalopram  definitely helps her depression and obsessive thoughts. She says that she has a new boyfriend and that they have been sexually active. Her boyfriend always uses condoms. He has been tested and has been HIV negative. She is having more problems with stress urinary incontinence and vaginal fullness. She underwent a hysterectomy in 1993. Sounds like she developed a cystocele and had 2 pelvic procedures about 15 years ago with mesh repair. She states that she can feel the mesh in her vagina and that the mesh occasionally tears condoms when she and her boyfriend are having intercourse. He is also told her that she snores excessively and occasionally appears to stop breathing.  Review of Systems: Pertinent items are noted in HPI.  Past Medical History  Diagnosis Date  . Hypertension   . HIV (human immunodeficiency virus infection)   . Nerve disorder   . Chest pain, unspecified     History  Substance Use Topics  . Smoking status: Current Every Day Smoker -- 1.00 packs/day for 30 years    Types: Cigarettes  . Smokeless tobacco: Never Used     Comment: Interested in Chantix  . Alcohol Use: 1.2 oz/week    2 Not specified per week    Family History  Problem Relation Age  of Onset  . Other Mother   . Heart attack Father     No Known Allergies  Objective: Temp: 99.5 F (37.5 C) (11/09 1506) Temp Source: Oral (11/09 1506) BP: 159/95 mmHg (11/09 1509) Pulse Rate: 84 (11/09 1509) Body mass index is 24.63 kg/(m^2).  General: She is in good spirits Oral: no oropharyngeal lesions Skin: no rash Lungs: clear Cor: regular S1 and S2 with no murmurs Abdomen: soft and nontender Joints and extremities: normal Neuro: alert with normal speech and conversation Mood and affect: Normal and appropriate  Lab Results Lab Results  Component Value Date   WBC 5.3 10/02/2014   HGB 14.2 10/02/2014   HCT 41.0 10/02/2014   MCV 91.9 10/02/2014   PLT 202 10/02/2014    Lab Results  Component Value Date    CREATININE 0.79 10/02/2014   BUN 12 10/02/2014   NA 138 10/02/2014   K 4.5 10/02/2014   CL 104 10/02/2014   CO2 27 10/02/2014    Lab Results  Component Value Date   ALT 13 10/02/2014   AST 19 10/02/2014   ALKPHOS 63 10/02/2014   BILITOT 0.5 10/02/2014    Lab Results  Component Value Date   CHOL 148 10/02/2014   HDL 40 10/02/2014   LDLCALC 85 10/02/2014   TRIG 117 10/02/2014   CHOLHDL 3.7 10/02/2014    Lab Results HIV 1 RNA QUANT (copies/mL)  Date Value  10/02/2014 <20  01/09/2014 <20  05/15/2013 <20   CD4 T CELL ABS  Date Value  10/02/2014 970 /uL  01/09/2014 760 /uL  05/15/2013 800 cmm     Assessment: Her HIV infection remains under excellent control. She prefers to continue her current regimen.  Her depression and obsessive thoughts are under better control.  She is having worse problems with stress urinary incontinence. She is requesting a urology referral. She cannot recall the name of the urologist who performed her previous procedures.  Sounds like she may have sleep apnea.  Plan: 1. Continue current medications 2. Sleep study 3. Urology referral 4. Follow-up here after blood work in 6 months   Cliffton AstersJohn Montoya Brandel, MD [facility] [phone] pager   [phone] cell 11/03/2014, 3:26 PM

## 2015-01-23 ENCOUNTER — Encounter (HOSPITAL_BASED_OUTPATIENT_CLINIC_OR_DEPARTMENT_OTHER): Payer: Medicare Other

## 2015-02-02 ENCOUNTER — Other Ambulatory Visit: Payer: Self-pay | Admitting: Internal Medicine

## 2015-02-02 ENCOUNTER — Other Ambulatory Visit: Payer: Self-pay | Admitting: Infectious Diseases

## 2015-02-02 DIAGNOSIS — F329 Major depressive disorder, single episode, unspecified: Secondary | ICD-10-CM

## 2015-02-02 DIAGNOSIS — E7800 Pure hypercholesterolemia, unspecified: Secondary | ICD-10-CM

## 2015-02-02 DIAGNOSIS — B20 Human immunodeficiency virus [HIV] disease: Secondary | ICD-10-CM

## 2015-02-02 DIAGNOSIS — F32A Depression, unspecified: Secondary | ICD-10-CM

## 2015-02-04 ENCOUNTER — Ambulatory Visit: Payer: Self-pay

## 2015-02-11 ENCOUNTER — Other Ambulatory Visit: Payer: Self-pay | Admitting: *Deleted

## 2015-02-11 DIAGNOSIS — B20 Human immunodeficiency virus [HIV] disease: Secondary | ICD-10-CM

## 2015-02-11 MED ORDER — RALTEGRAVIR POTASSIUM 400 MG PO TABS: 400.0000 mg | ORAL_TABLET | Freq: Two times a day (BID) | ORAL | Status: DC

## 2015-02-11 MED ORDER — EMTRICITABINE-TENOFOVIR DF 200-300 MG PO TABS: 1.0000 | ORAL_TABLET | Freq: Every day | ORAL | Status: DC

## 2015-02-11 NOTE — Telephone Encounter (Signed)
ADAP Application 

## 2015-05-03 ENCOUNTER — Other Ambulatory Visit: Payer: Self-pay | Admitting: Internal Medicine

## 2015-05-03 DIAGNOSIS — B20 Human immunodeficiency virus [HIV] disease: Secondary | ICD-10-CM

## 2015-05-03 DIAGNOSIS — I10 Essential (primary) hypertension: Secondary | ICD-10-CM

## 2015-05-04 ENCOUNTER — Telehealth: Payer: Self-pay | Admitting: *Deleted

## 2015-05-04 NOTE — Telephone Encounter (Signed)
Patient requested refills, authorized 2 months.  Patient is due for her 6 month appointment, has not yet made it.  Left message requesting her to call and set up labs/appointment. Andree CossHowell, Michelle M, RN

## 2015-05-14 ENCOUNTER — Other Ambulatory Visit: Payer: Medicare Other

## 2015-05-14 DIAGNOSIS — B20 Human immunodeficiency virus [HIV] disease: Secondary | ICD-10-CM

## 2015-05-14 DIAGNOSIS — Z79899 Other long term (current) drug therapy: Secondary | ICD-10-CM

## 2015-05-14 DIAGNOSIS — Z113 Encounter for screening for infections with a predominantly sexual mode of transmission: Secondary | ICD-10-CM

## 2015-05-14 LAB — COMPREHENSIVE METABOLIC PANEL
ALK PHOS: 57 U/L (ref 39–117)
ALT: 13 U/L (ref 0–35)
AST: 16 U/L (ref 0–37)
Albumin: 4.2 g/dL (ref 3.5–5.2)
BILIRUBIN TOTAL: 0.5 mg/dL (ref 0.2–1.2)
BUN: 13 mg/dL (ref 6–23)
CALCIUM: 9.5 mg/dL (ref 8.4–10.5)
CO2: 28 mEq/L (ref 19–32)
Chloride: 106 mEq/L (ref 96–112)
Creat: 0.81 mg/dL (ref 0.50–1.10)
GLUCOSE: 48 mg/dL — AB (ref 70–99)
POTASSIUM: 4.2 meq/L (ref 3.5–5.3)
SODIUM: 141 meq/L (ref 135–145)
TOTAL PROTEIN: 6.7 g/dL (ref 6.0–8.3)

## 2015-05-14 LAB — CBC
HCT: 41.8 % (ref 36.0–46.0)
Hemoglobin: 14 g/dL (ref 12.0–15.0)
MCH: 30.5 pg (ref 26.0–34.0)
MCHC: 33.5 g/dL (ref 30.0–36.0)
MCV: 91.1 fL (ref 78.0–100.0)
MPV: 10.6 fL (ref 8.6–12.4)
Platelets: 186 10*3/uL (ref 150–400)
RBC: 4.59 MIL/uL (ref 3.87–5.11)
RDW: 14.2 % (ref 11.5–15.5)
WBC: 5.2 10*3/uL (ref 4.0–10.5)

## 2015-05-14 LAB — LIPID PANEL
Cholesterol: 134 mg/dL (ref 0–200)
HDL: 43 mg/dL — AB (ref >=46)
LDL CALC: 76 mg/dL (ref 0–99)
TRIGLYCERIDES: 76 mg/dL (ref <150)
Total CHOL/HDL Ratio: 3.1 Ratio
VLDL: 15 mg/dL (ref 0–40)

## 2015-05-14 NOTE — Addendum Note (Signed)
Addended by: Mariea ClontsGREEN, Sitlali Koerner D on: 05/14/2015 03:38 PM   Modules accepted: Orders

## 2015-05-15 LAB — HIV-1 RNA QUANT-NO REFLEX-BLD
HIV 1 RNA Quant: 29 copies/mL — ABNORMAL HIGH (ref <20)
HIV-1 RNA Quant, Log: 1.46 {Log} — ABNORMAL HIGH (ref <1.30)

## 2015-05-15 LAB — RPR

## 2015-05-15 LAB — T-HELPER CELL (CD4) - (RCID CLINIC ONLY)
CD4 % Helper T Cell: 36 % (ref 33–55)
CD4 T CELL ABS: 920 /uL (ref 400–2700)

## 2015-06-02 ENCOUNTER — Ambulatory Visit (INDEPENDENT_AMBULATORY_CARE_PROVIDER_SITE_OTHER): Payer: Medicare Other | Admitting: Internal Medicine

## 2015-06-02 ENCOUNTER — Other Ambulatory Visit: Payer: Self-pay | Admitting: *Deleted

## 2015-06-02 ENCOUNTER — Encounter: Payer: Self-pay | Admitting: Internal Medicine

## 2015-06-02 DIAGNOSIS — E7800 Pure hypercholesterolemia, unspecified: Secondary | ICD-10-CM

## 2015-06-02 DIAGNOSIS — F32A Depression, unspecified: Secondary | ICD-10-CM

## 2015-06-02 DIAGNOSIS — F329 Major depressive disorder, single episode, unspecified: Secondary | ICD-10-CM

## 2015-06-02 DIAGNOSIS — I10 Essential (primary) hypertension: Secondary | ICD-10-CM

## 2015-06-02 DIAGNOSIS — B20 Human immunodeficiency virus [HIV] disease: Secondary | ICD-10-CM

## 2015-06-02 MED ORDER — ATENOLOL 50 MG PO TABS: 50.0000 mg | ORAL_TABLET | Freq: Every day | ORAL | Status: DC

## 2015-06-02 MED ORDER — ATORVASTATIN CALCIUM 10 MG PO TABS: 10.0000 mg | ORAL_TABLET | Freq: Every day | ORAL | Status: DC

## 2015-06-02 MED ORDER — ELVITEG-COBIC-EMTRICIT-TENOFAF 150-150-200-10 MG PO TABS: 1.0000 | ORAL_TABLET | Freq: Every day | ORAL | Status: DC

## 2015-06-02 MED ORDER — CITALOPRAM HYDROBROMIDE 40 MG PO TABS: 40.0000 mg | ORAL_TABLET | Freq: Every day | ORAL | Status: DC

## 2015-06-02 NOTE — Telephone Encounter (Signed)
Requested refills

## 2015-06-02 NOTE — Progress Notes (Signed)
Patient ID: Dawn Byrd, female   DOB: June 05, 1956, 59 y.o.   MRN: [id number]          Patient Active Problem List   Diagnosis Date Noted  . Human immunodeficiency virus (HIV) disease 10/27/2006    Priority: High  . Dyslipidemia 03/29/2012    Priority: Medium  . Essential hypertension, benign 07/09/2010    Priority: Medium  . TOBACCO USER 03/19/2010    Priority: Medium  . DEPRESSION, MILD 03/19/2010    Priority: Medium  . DISORDERS, OBSESSIVE-COMPULSIVE 10/27/2006    Priority: Medium  . Snoring 11/03/2014  . Chest pain 03/26/2012  . Chronic nausea 07/12/2011  . Insomnia 07/12/2011  . PNEUMOCOCCAL PNEUMONIA 02/12/2007  . Degenerative disc disease 10/27/2006    Patient's Medications  New Prescriptions   ELVITEGRAVIR-COBICISTAT-EMTRICITABINE-TENOFOVIR (GENVOYA) 150-150-200-10 MG TABS TABLET    Take 1 tablet by mouth daily with breakfast.  Previous Medications   ATENOLOL (TENORMIN) 50 MG TABLET    TAKE 1 TABLET BY MOUTH EVERY DAY   ATORVASTATIN (LIPITOR) 10 MG TABLET    TAKE 1 TABLET BY MOUTH DAILY   CITALOPRAM (CELEXA) 40 MG TABLET    TAKE 1 TABLET BY MOUTH EVERY DAY   FOLIC ACID (FOLVITE) 400 MCG TABLET    Take 400 mcg by mouth daily.     OXYCODONE-ACETAMINOPHEN (PERCOCET) 10-325 MG PER TABLET    Take 1 tablet by mouth every 6 (six) hours as needed. For pain   POTASSIUM CHLORIDE (KLOR-CON) 20 MEQ PACKET    Take 20 mEq by mouth 2 (two) times daily.  Modified Medications   No medications on file  Discontinued Medications   RALTEGRAVIR (ISENTRESS) 400 MG TABLET    Take 1 tablet (400 mg total) by mouth 2 (two) times daily.   TRUVADA 200-300 MG PER TABLET    TAKE 1 TABLET BY MOUTH EVERY DAY    Subjective: Dawn Byrd is in for her routine HIV follow-up visit.She tells me now that she is only taking her Isentress once daily with her Truvada. She has been doing this ever since she started on this current regimen. Fortunately she does not miss doses. She continues to smoke one pack of  cigarettes daily but believes that she will have to quit-she will be moving to [place] to live with her daughter and grandchildren this July. She is very excited about the move.   She was seen by Dr. Vanna Scotland, a urologist, recently because of the problem she's had following her mesh repair of her cystocele. Dr. Corliss Blacker referred her to Dr. Lorin Picket Mcdiarmid but she has not been able to schedule that visit. She does not believe she will be able to do that before she moves to [place].   Review of Systems: Pertinent items are noted in HPI.  Past Medical History  Diagnosis Date  . Hypertension   . HIV (human immunodeficiency virus infection)   . Nerve disorder   . Chest pain, unspecified     History  Substance Use Topics  . Smoking status: Current Every Day Smoker -- 1.00 packs/day for 30 years    Types: Cigarettes  . Smokeless tobacco: Never Used     Comment: Interested in Chantix, never filled the rx  . Alcohol Use: 1.2 oz/week    2 Standard drinks or equivalent per week    Family History  Problem Relation Age of Onset  . Other Mother   . Heart attack Father     No Known Allergies  Objective: Temp: 98.2 F (  36.8 C) (06/07 1034) Temp Source: Oral (06/07 1034) BP: 120/78 mmHg (06/07 1034) Pulse Rate: 62 (06/07 1034) Body mass index is 23.17 kg/(m^2).  General: She is smiling and in good spirits   Lab Results Lab Results  Component Value Date   WBC 5.2 05/14/2015   HGB 14.0 05/14/2015   HCT 41.8 05/14/2015   MCV 91.1 05/14/2015   PLT 186 05/14/2015    Lab Results  Component Value Date   CREATININE 0.81 05/14/2015   BUN 13 05/14/2015   NA 141 05/14/2015   K 4.2 05/14/2015   CL 106 05/14/2015   CO2 28 05/14/2015    Lab Results  Component Value Date   ALT 13 05/14/2015   AST 16 05/14/2015   ALKPHOS 57 05/14/2015   BILITOT 0.5 05/14/2015    Lab Results  Component Value Date   CHOL 134 05/14/2015   HDL 43* 05/14/2015   LDLCALC 76 05/14/2015   TRIG  76 05/14/2015   CHOLHDL 3.1 05/14/2015    Lab Results HIV 1 RNA QUANT (copies/mL)  Date Value  05/14/2015 29*  10/02/2014 <20  01/09/2014 <20   CD4 T CELL ABS (/uL)  Date Value  05/14/2015 920  10/02/2014 970  01/09/2014 760     Assessment: She has very low-level viral activation. Since she is not taking her current regimen correctly I have convinced her to make a change that will both simplify and strengthen her regimen. She agrees to switching to once daily Genvoya.   Plan: Change anti-retroviral regimen to once daily Genvoya with a meal She has been given the names of some HIV clinics [place]  Cliffton AstersJohn Irini Leet, MD [facility] [phone] pager   [phone] cell 06/02/2015, 10:58 AM

## 2015-10-05 ENCOUNTER — Ambulatory Visit (INDEPENDENT_AMBULATORY_CARE_PROVIDER_SITE_OTHER): Payer: Medicare Other

## 2015-10-05 DIAGNOSIS — Z23 Encounter for immunization: Secondary | ICD-10-CM

## 2016-01-04 ENCOUNTER — Other Ambulatory Visit: Payer: Medicare Other

## 2016-01-08 ENCOUNTER — Other Ambulatory Visit: Payer: Medicare Other

## 2016-01-08 ENCOUNTER — Other Ambulatory Visit: Payer: Self-pay | Admitting: Internal Medicine

## 2016-01-08 DIAGNOSIS — Z21 Asymptomatic human immunodeficiency virus [HIV] infection status: Secondary | ICD-10-CM

## 2016-01-08 LAB — CBC WITH DIFFERENTIAL/PLATELET
BASOS PCT: 1 % (ref 0–1)
Basophils Absolute: 0 10*3/uL (ref 0.0–0.1)
Eosinophils Absolute: 0.1 10*3/uL (ref 0.0–0.7)
Eosinophils Relative: 3 % (ref 0–5)
HEMATOCRIT: 40.5 % (ref 36.0–46.0)
HEMOGLOBIN: 13.8 g/dL (ref 12.0–15.0)
LYMPHS PCT: 47 % — AB (ref 12–46)
Lymphs Abs: 1.9 10*3/uL (ref 0.7–4.0)
MCH: 31.6 pg (ref 26.0–34.0)
MCHC: 34.1 g/dL (ref 30.0–36.0)
MCV: 92.7 fL (ref 78.0–100.0)
MONO ABS: 0.4 10*3/uL (ref 0.1–1.0)
MONOS PCT: 9 % (ref 3–12)
MPV: 10.6 fL (ref 8.6–12.4)
NEUTROS ABS: 1.6 10*3/uL — AB (ref 1.7–7.7)
NEUTROS PCT: 40 % — AB (ref 43–77)
Platelets: 153 10*3/uL (ref 150–400)
RBC: 4.37 MIL/uL (ref 3.87–5.11)
RDW: 13.8 % (ref 11.5–15.5)
WBC: 4 10*3/uL (ref 4.0–10.5)

## 2016-01-08 LAB — COMPLETE METABOLIC PANEL WITH GFR
ALBUMIN: 3.9 g/dL (ref 3.6–5.1)
ALK PHOS: 53 U/L (ref 33–130)
ALT: 12 U/L (ref 6–29)
AST: 17 U/L (ref 10–35)
BILIRUBIN TOTAL: 0.4 mg/dL (ref 0.2–1.2)
BUN: 16 mg/dL (ref 7–25)
CALCIUM: 9.7 mg/dL (ref 8.6–10.4)
CO2: 27 mmol/L (ref 20–31)
CREATININE: 0.85 mg/dL (ref 0.50–1.05)
Chloride: 105 mmol/L (ref 98–110)
GFR, EST NON AFRICAN AMERICAN: 76 mL/min (ref >=60)
GFR, Est African American: 88 mL/min (ref >=60)
GLUCOSE: 82 mg/dL (ref 65–99)
POTASSIUM: 4.7 mmol/L (ref 3.5–5.3)
SODIUM: 141 mmol/L (ref 135–146)
TOTAL PROTEIN: 6.2 g/dL (ref 6.1–8.1)

## 2016-01-08 LAB — T-HELPER CELL (CD4) - (RCID CLINIC ONLY)
CD4 % Helper T Cell: 34 % (ref 33–55)
CD4 T CELL ABS: 630 /uL (ref 400–2700)

## 2016-01-11 LAB — HIV-1 RNA QUANT-NO REFLEX-BLD: HIV 1 RNA Quant: <20 copies/mL (ref <20)

## 2016-01-26 ENCOUNTER — Ambulatory Visit: Payer: Medicare Other | Admitting: Internal Medicine

## 2016-01-26 ENCOUNTER — Telehealth: Payer: Self-pay | Admitting: *Deleted

## 2016-01-26 NOTE — Telephone Encounter (Signed)
Requested pt call [facility] to make a new appt with Dr. Campbell. 

## 2016-02-02 ENCOUNTER — Ambulatory Visit (INDEPENDENT_AMBULATORY_CARE_PROVIDER_SITE_OTHER): Payer: Medicare Other | Admitting: Internal Medicine

## 2016-02-02 ENCOUNTER — Encounter: Payer: Self-pay | Admitting: Internal Medicine

## 2016-02-02 VITALS — BP 161/84 | HR 65 | Temp 98.4°F | Wt 152.5 lb

## 2016-02-02 DIAGNOSIS — F17200 Nicotine dependence, unspecified, uncomplicated: Secondary | ICD-10-CM

## 2016-02-02 DIAGNOSIS — J069 Acute upper respiratory infection, unspecified: Secondary | ICD-10-CM

## 2016-02-02 DIAGNOSIS — B20 Human immunodeficiency virus [HIV] disease: Secondary | ICD-10-CM

## 2016-02-02 MED ORDER — VARENICLINE TARTRATE 0.5 MG X 11 & 1 MG X 42 PO MISC: ORAL | Status: DC

## 2016-02-02 NOTE — Assessment & Plan Note (Signed)
She has an acute upper respiratory infection, most likely viral in origin. I will have her continue to treat it symptomatically. I suggested that she try generic Afrin nasal spray and Robitussin-DM along with acetaminophen as needed.

## 2016-02-02 NOTE — Progress Notes (Signed)
[facility]  Patient Active Problem List   Diagnosis Date Noted  . Human immunodeficiency virus (HIV) disease (HCC) 10/27/2006    Priority: High  . Dyslipidemia 03/29/2012    Priority: Medium  . Essential hypertension, benign 07/09/2010    Priority: Medium  . TOBACCO USER 03/19/2010    Priority: Medium  . DEPRESSION, MILD 03/19/2010    Priority: Medium  . DISORDERS, OBSESSIVE-COMPULSIVE 10/27/2006    Priority: Medium  . Acute upper respiratory infection 02/02/2016  . Snoring 11/03/2014  . Chest pain 03/26/2012  . Chronic nausea 07/12/2011  . Insomnia 07/12/2011  . PNEUMOCOCCAL PNEUMONIA 02/12/2007  . Degenerative disc disease 10/27/2006    Patient's Medications  New Prescriptions   VARENICLINE (CHANTIX PAK) 0.5 MG X 11 & 1 MG X 42 TABLET    Take one 0.5 mg tablet by mouth once daily for 3 days, then increase to one 0.5 mg tablet twice daily for 4 days, then increase to one 1 mg tablet twice daily.  Previous Medications   ATENOLOL (TENORMIN) 50 MG TABLET    Take 1 tablet (50 mg total) by mouth daily.   ATORVASTATIN (LIPITOR) 10 MG TABLET    Take 1 tablet (10 mg total) by mouth daily.   CITALOPRAM (CELEXA) 40 MG TABLET    Take 1 tablet (40 mg total) by mouth daily.   ELVITEGRAVIR-COBICISTAT-EMTRICITABINE-TENOFOVIR (GENVOYA) 150-150-200-10 MG TABS TABLET    Take 1 tablet by mouth daily with breakfast.   FOLIC ACID (FOLVITE) 400 MCG TABLET    Take 400 mcg by mouth daily.     OXYCODONE-ACETAMINOPHEN (PERCOCET) 10-325 MG PER TABLET    Take 1 tablet by mouth every 6 (six) hours as needed. For pain   POTASSIUM CHLORIDE (KLOR-CON) 20 MEQ PACKET    Take 20 mEq by mouth 2 (two) times daily.  Modified Medications   No medications on file  Discontinued Medications   No medications on file    Subjective: Dawn Byrd is in for her routine HIV follow-up visit. She takes generally a each evening with her dinner. She does not recall missing any doses. She  has not moved to [place] yet live with her daughter because her father died last summer and she is having to settle his state. This is been very stressful but she denies feeling depressed. 2 weeks ago she developed cough and increasing shortness of breath. She has not been producing any sputum. She thinks she may have had some fever with some slight chills yesterday. She continues to smoke cigarettes.  Review of Systems: Review of Systems  Constitutional: Positive for fever, chills and malaise/fatigue. Negative for weight loss and diaphoresis.  HENT: Positive for congestion. Negative for sore throat.   Respiratory: Positive for cough and shortness of breath. Negative for hemoptysis, sputum production and wheezing.   Cardiovascular: Positive for chest pain.  Gastrointestinal: Negative for nausea, vomiting and diarrhea.  Genitourinary: Negative for dysuria and frequency.  Musculoskeletal: Positive for neck pain. Negative for myalgias and joint pain.  Skin: Negative for rash.  Neurological: Negative for speech change and headaches.  Psychiatric/Behavioral: Negative for depression and substance abuse. The patient is not nervous/anxious.     Past Medical History  Diagnosis Date  . Hypertension   . HIV (human immunodeficiency virus infection) (HCC)   . Nerve disorder   . Chest pain, unspecified     Social History  Substance Use Topics  . Smoking status: Current Every Day Smoker --  1.00 packs/day for 30 years    Types: Cigarettes  . Smokeless tobacco: Never Used     Comment: Interested in Chantix, never filled the rx  . Alcohol Use: 1.2 oz/week    2 Standard drinks or equivalent per week    Family History  Problem Relation Age of Onset  . Other Mother   . Heart attack Father     No Known Allergies  Objective: Filed Vitals:   02/02/16 0939  BP: 161/84  Pulse: 65  Temp: 98.4 F (36.9 C)  TempSrc: Oral  Weight: 152 lb 8 oz (69.174 kg)  SpO2: 100%   Body mass index is 24.63  kg/(m^2).  Physical Exam  Constitutional: She is oriented to person, place, and time.  She sounds congested.  HENT:  Mouth/Throat: No oropharyngeal exudate.  Eyes: Conjunctivae are normal.  Neck: Normal range of motion.  Cardiovascular: Normal rate and regular rhythm.   No murmur heard. Pulmonary/Chest: She has wheezes. She has no rales.  I heard one faint wheeze in her right upper lung posteriorly.  Abdominal: Soft. She exhibits no mass. There is no tenderness.  Musculoskeletal: Normal range of motion.  Neurological: She is alert and oriented to person, place, and time.  Skin: No rash noted.  Psychiatric: Mood and affect normal.    Lab Results    Problem List Items Addressed This Visit      High   Human immunodeficiency virus (HIV) disease (HCC)    Her HIV infection is under excellent control. She will continue gently and follow-up after lab work in 6 months.      Relevant Orders   T-helper cell (CD4)- ([facility] only)   HIV 1 RNA quant-no reflex-bld     Medium   TOBACCO USER    I talked to her again about the importance of quitting cigarettes. She was able to quit for about 6 months several years ago. She took Chantix at that time and did not have any problems tolerating it. I talked to her about a cessation plan. I will have her restart Chantix 1 week before her upcoming quit date.      Relevant Medications   varenicline (CHANTIX PAK) 0.5 MG X 11 & 1 MG X 42 tablet     Unprioritized   Acute upper respiratory infection - Primary    She has an acute upper respiratory infection, most likely viral in origin. I will have her continue to treat it symptomatically. I suggested that she try generic Afrin nasal spray and Robitussin-DM along with acetaminophen as needed.          Cliffton Asters, MD [facility] [phone] pager   [phone] cell 02/02/2016, 10:17 AM

## 2016-02-02 NOTE — Patient Instructions (Signed)
Try generic Afrin nasal spray and Robitussin-DM for the next 4-5 days for your bronchitis and sinusitis symptoms. Also continue to work on your plan to stop smoking cigarettes.

## 2016-02-02 NOTE — Assessment & Plan Note (Signed)
I talked to her again about the importance of quitting cigarettes. She was able to quit for about 6 months several years ago. She took Chantix at that time and did not have any problems tolerating it. I talked to her about a cessation plan. I will have her restart Chantix 1 week before her upcoming quit date.

## 2016-02-02 NOTE — Assessment & Plan Note (Signed)
Her HIV infection is under excellent control. She will continue gently and follow-up after lab work in 6 months.

## 2016-02-08 ENCOUNTER — Encounter: Payer: Self-pay | Admitting: Emergency Medicine

## 2016-02-08 ENCOUNTER — Emergency Department
Admission: EM | Admit: 2016-02-08 | Discharge: 2016-02-08 | Disposition: A | Discharge status: Home / Self Care | Payer: Medicare Other | Attending: Emergency Medicine | Admitting: Emergency Medicine

## 2016-02-08 ENCOUNTER — Emergency Department: Payer: Medicare Other

## 2016-02-08 DIAGNOSIS — R079 Chest pain, unspecified: Secondary | ICD-10-CM | POA: Diagnosis present

## 2016-02-08 DIAGNOSIS — Z79899 Other long term (current) drug therapy: Secondary | ICD-10-CM | POA: Insufficient documentation

## 2016-02-08 DIAGNOSIS — R0602 Shortness of breath: Secondary | ICD-10-CM | POA: Diagnosis not present

## 2016-02-08 DIAGNOSIS — I10 Essential (primary) hypertension: Secondary | ICD-10-CM | POA: Insufficient documentation

## 2016-02-08 DIAGNOSIS — F17210 Nicotine dependence, cigarettes, uncomplicated: Secondary | ICD-10-CM | POA: Insufficient documentation

## 2016-02-08 LAB — COMPREHENSIVE METABOLIC PANEL
ALT: 20 U/L (ref 14–54)
ANION GAP: 8 (ref 5–15)
AST: 28 U/L (ref 15–41)
Albumin: 4.5 g/dL (ref 3.5–5.0)
Alkaline Phosphatase: 69 U/L (ref 38–126)
BILIRUBIN TOTAL: 0.7 mg/dL (ref 0.3–1.2)
BUN: 12 mg/dL (ref 6–20)
CHLORIDE: 105 mmol/L (ref 101–111)
CO2: 26 mmol/L (ref 22–32)
Calcium: 9.4 mg/dL (ref 8.9–10.3)
Creatinine, Ser: 0.83 mg/dL (ref 0.44–1.00)
Glucose, Bld: 122 mg/dL — ABNORMAL HIGH (ref 65–99)
POTASSIUM: 4 mmol/L (ref 3.5–5.1)
Sodium: 139 mmol/L (ref 135–145)
TOTAL PROTEIN: 7.8 g/dL (ref 6.5–8.1)

## 2016-02-08 LAB — CBC WITH DIFFERENTIAL/PLATELET
BASOS PCT: 1 %
Basophils Absolute: 0 10*3/uL (ref 0–0.1)
EOS ABS: 0.1 10*3/uL (ref 0–0.7)
Eosinophils Relative: 2 %
HEMATOCRIT: 45.4 % (ref 35.0–47.0)
HEMOGLOBIN: 15.3 g/dL (ref 12.0–16.0)
LYMPHS ABS: 2.4 10*3/uL (ref 1.0–3.6)
Lymphocytes Relative: 47 %
MCH: 31.8 pg (ref 26.0–34.0)
MCHC: 33.7 g/dL (ref 32.0–36.0)
MCV: 94.3 fL (ref 80.0–100.0)
Monocytes Absolute: 0.4 10*3/uL (ref 0.2–0.9)
Monocytes Relative: 8 %
NEUTROS ABS: 2.1 10*3/uL (ref 1.4–6.5)
NEUTROS PCT: 42 %
Platelets: 164 10*3/uL (ref 150–440)
RBC: 4.81 MIL/uL (ref 3.80–5.20)
RDW: 13.7 % (ref 11.5–14.5)
WBC: 5 10*3/uL (ref 3.6–11.0)

## 2016-02-08 LAB — TROPONIN I

## 2016-02-08 MED ORDER — IPRATROPIUM-ALBUTEROL 0.5-2.5 (3) MG/3ML IN SOLN
3.0000 mL | Freq: Once | RESPIRATORY_TRACT | Status: AC
  Administered 2016-02-08: 3 mL via RESPIRATORY_TRACT
  Filled 2016-02-08: qty 3

## 2016-02-08 NOTE — ED Notes (Signed)
Reports chest pain rad into left arm x 1 month, worse today. Skin w/d with good color.

## 2016-02-08 NOTE — ED Provider Notes (Signed)
[facility] Emergency Department Provider Note  ____________________________________________    I have reviewed the triage vital signs and the nursing notes.   HISTORY  Chief Complaint Chest Pain    HPI Dawn Byrd is a 60 y.o. female who presents with complaints of chest pain over the last month. She reports the pain is intermittent but seems to happen almost daily. She does report it seems to get worse with exertion. She reports the pain is pressure-like when it happens. She reports she had the pain this morning but is currently chest pain-free. She does smoke cigarettes and has for 40 years. She occasionally has chest tightness and mild shortness of breath. No recent travel. No calf pain or swelling. She has a history of HIV which is well-controlled with medications. She is compliant with all of her medications     Past Medical History  Diagnosis Date  . Hypertension   . HIV (human immunodeficiency virus infection) (HCC)   . Nerve disorder   . Chest pain, unspecified     Patient Active Problem List   Diagnosis Date Noted  . Acute upper respiratory infection 02/02/2016  . Snoring 11/03/2014  . Dyslipidemia 03/29/2012  . Chest pain 03/26/2012  . Chronic nausea 07/12/2011  . Insomnia 07/12/2011  . Essential hypertension, benign 07/09/2010  . TOBACCO USER 03/19/2010  . DEPRESSION, MILD 03/19/2010  . PNEUMOCOCCAL PNEUMONIA 02/12/2007  . Human immunodeficiency virus (HIV) disease (HCC) 10/27/2006  . DISORDERS, OBSESSIVE-COMPULSIVE 10/27/2006  . Degenerative disc disease 10/27/2006    Past Surgical History  Procedure Laterality Date  . Rupture disc neck  12/2006  . Abdominal hysterectomy      Current Outpatient Rx  Name  Route  Sig  Dispense  Refill  . atenolol (TENORMIN) 50 MG tablet   Oral   Take 1 tablet (50 mg total) by mouth daily.   30 tablet   11   . atorvastatin (LIPITOR) 10 MG tablet   Oral   Take 1 tablet (10 mg total) by  mouth daily.   30 tablet   11   . citalopram (CELEXA) 40 MG tablet   Oral   Take 1 tablet (40 mg total) by mouth daily.   30 tablet   11   . elvitegravir-cobicistat-emtricitabine-tenofovir (GENVOYA) 150-150-200-10 MG TABS tablet   Oral   Take 1 tablet by mouth daily with breakfast.   30 tablet   11   . folic acid (FOLVITE) 400 MCG tablet   Oral   Take 400 mcg by mouth daily.           Marland Kitchen oxyCODONE-acetaminophen (PERCOCET) 10-325 MG per tablet   Oral   Take 1 tablet by mouth every 6 (six) hours as needed. For pain   120 tablet   0   . potassium chloride (KLOR-CON) 20 MEQ packet   Oral   Take 20 mEq by mouth 2 (two) times daily.         . varenicline (CHANTIX PAK) 0.5 MG X 11 & 1 MG X 42 tablet      Take one 0.5 mg tablet by mouth once daily for 3 days, then increase to one 0.5 mg tablet twice daily for 4 days, then increase to one 1 mg tablet twice daily.   53 tablet   1     Allergies Review of patient's allergies indicates no known allergies.  Family History  Problem Relation Age of Onset  . Other Mother   . Heart attack Father  Social History Social History  Substance Use Topics  . Smoking status: Current Every Day Smoker -- 1.00 packs/day for 30 years    Types: Cigarettes  . Smokeless tobacco: Never Used     Comment: Interested in Chantix, never filled the rx  . Alcohol Use: 1.2 oz/week    2 Standard drinks or equivalent per week    Review of Systems  Constitutional: Negative for fever. Eyes: Negative for visual changes. ENT: Negative for sore throat Cardiovascular: As above Respiratory: Negative for shortness of breath. Gastrointestinal: Negative for abdominal pain Genitourinary: Negative for dysuria. Musculoskeletal: Negative for back pain. Skin: Negative for rash. Neurological: Negative for headaches or focal weakness Psychiatric no anxiety    ____________________________________________   PHYSICAL EXAM:  VITAL SIGNS: ED  Triage Vitals  Enc Vitals Group     BP 02/08/16 1200 125/72 mmHg     Pulse Rate 02/08/16 1200 60     Resp 02/08/16 1200 11     Temp --      Temp src --      SpO2 02/08/16 1200 99 %     Weight --      Height --      Head Cir --      Peak Flow --      Pain Score --      Pain Loc --      Pain Edu? --      Excl. in GC? --      Constitutional: Alert and oriented. Well appearing and in no distress. Eyes: Conjunctivae are normal.  ENT   Head: Normocephalic and atraumatic.   Mouth/Throat: Mucous membranes are moist. Cardiovascular: Normal rate, regular rhythm. Normal and symmetric distal pulses are present in all extremities. No murmurs, rubs, or gallops. Respiratory: Normal respiratory effort without tachypnea nor retractions. Breath sounds are clear and equal bilaterally.  Gastrointestinal: Soft and non-tender in all quadrants. No distention. There is no CVA tenderness. Genitourinary: deferred Musculoskeletal: Nontender with normal range of motion in all extremities. No lower extremity tenderness nor edema. Neurologic:  Normal speech and language. No gross focal neurologic deficits are appreciated. Skin:  Skin is warm, dry and intact. No rash noted. Psychiatric: Mood and affect are normal. Patient exhibits appropriate insight and judgment.  ____________________________________________    LABS (pertinent positives/negatives)  Labs Reviewed  COMPREHENSIVE METABOLIC PANEL - Abnormal; Notable for the following:    Glucose, Bld 122 (*)    All other components within normal limits  CBC WITH DIFFERENTIAL/PLATELET  TROPONIN I    ____________________________________________   EKG  ED ECG REPORT I, Jene Every, the attending physician, personally viewed and interpreted this ECG.  Date: 02/08/2016 EKG Time: 10:24 AM Rate: 66 Rhythm: normal sinus rhythm QRS Axis: normal Intervals: normal ST/T Wave abnormalities: normal Conduction Disturbances: none Narrative  Interpretation: unremarkable    ____________________________________________    RADIOLOGY I have personally reviewed any xrays that were ordered on this patient: Chest x-ray unremarkable  ____________________________________________   PROCEDURES  Procedure(s) performed: none  Critical Care performed: none  ____________________________________________   INITIAL IMPRESSION / ASSESSMENT AND PLAN / ED COURSE  Pertinent labs & imaging results that were available during my care of the patient were reviewed by me and considered in my medical decision making (see chart for details).  Patient well-appearing and in no distress. She is chest pain-free in the emergency department. Her EKG is completed normal. Her troponin is normal. Her labs are unremarkable. Her exam is benign. Chest x-ray is clear.  Discussed the patient with Dr. Kirke Corin of cardiology and he agrees to follow up with the patient in his office for further workup and likely stress test. I discussed with patient she agrees to this plan. Return precautions discussed.  ____________________________________________   FINAL CLINICAL IMPRESSION(S) / ED DIAGNOSES  Final diagnoses:  Chest pain, unspecified chest pain type     Jene Every, MD 02/08/16 1525

## 2016-02-08 NOTE — Discharge Instructions (Signed)
Nonspecific Chest Pain  °Chest pain can be caused by many different conditions. There is always a chance that your pain could be related to something serious, such as a heart attack or a blood clot in your lungs. Chest pain can also be caused by conditions that are not life-threatening. If you have chest pain, it is very important to follow up with your health care provider. °CAUSES  °Chest pain can be caused by: °· Heartburn. °· Pneumonia or bronchitis. °· Anxiety or stress. °· Inflammation around your heart (pericarditis) or lung (pleuritis or pleurisy). °· A blood clot in your lung. °· A collapsed lung (pneumothorax). It can develop suddenly on its own (spontaneous pneumothorax) or from trauma to the chest. °· Shingles infection (varicella-zoster virus). °· Heart attack. °· Damage to the bones, muscles, and cartilage that make up your chest wall. This can include: °¨ Bruised bones due to injury. °¨ Strained muscles or cartilage due to frequent or repeated coughing or overwork. °¨ Fracture to one or more ribs. °¨ Sore cartilage due to inflammation (costochondritis). °RISK FACTORS  °Risk factors for chest pain may include: °· Activities that increase your risk for trauma or injury to your chest. °· Respiratory infections or conditions that cause frequent coughing. °· Medical conditions or overeating that can cause heartburn. °· Heart disease or family history of heart disease. °· Conditions or health behaviors that increase your risk of developing a blood clot. °· Having had chicken pox (varicella zoster). °SIGNS AND SYMPTOMS °Chest pain can feel like: °· Burning or tingling on the surface of your chest or deep in your chest. °· Crushing, pressure, aching, or squeezing pain. °· Dull or sharp pain that is worse when you move, cough, or take a deep breath. °· Pain that is also felt in your back, neck, shoulder, or arm, or pain that spreads to any of these areas. °Your chest pain may come and go, or it may stay  constant. °DIAGNOSIS °Lab tests or other studies may be needed to find the cause of your pain. Your health care provider may have you take a test called an ambulatory ECG (electrocardiogram). An ECG records your heartbeat patterns at the time the test is performed. You may also have other tests, such as: °· Transthoracic echocardiogram (TTE). During echocardiography, sound waves are used to create a picture of all of the heart structures and to look at how blood flows through your heart. °· Transesophageal echocardiogram (TEE). This is a more advanced imaging test that obtains images from inside your body. It allows your health care provider to see your heart in finer detail. °· Cardiac monitoring. This allows your health care provider to monitor your heart rate and rhythm in real time. °· Holter monitor. This is a portable device that records your heartbeat and can help to diagnose abnormal heartbeats. It allows your health care provider to track your heart activity for several days, if needed. °· Stress tests. These can be done through exercise or by taking medicine that makes your heart beat more quickly. °· Blood tests. °· Imaging tests. °TREATMENT  °Your treatment depends on what is causing your chest pain. Treatment may include: °· Medicines. These may include: °¨ Acid blockers for heartburn. °¨ Anti-inflammatory medicine. °¨ Pain medicine for inflammatory conditions. °¨ Antibiotic medicine, if an infection is present. °¨ Medicines to dissolve blood clots. °¨ Medicines to treat coronary artery disease. °· Supportive care for conditions that do not require medicines. This may include: °¨ Resting. °¨ Applying heat   or cold packs to injured areas. °¨ Limiting activities until pain decreases. °HOME CARE INSTRUCTIONS °· If you were prescribed an antibiotic medicine, finish it all even if you start to feel better. °· Avoid any activities that bring on chest pain. °· Do not use any tobacco products, including  cigarettes, chewing tobacco, or electronic cigarettes. If you need help quitting, ask your health care provider. °· Do not drink alcohol. °· Take medicines only as directed by your health care provider. °· Keep all follow-up visits as directed by your health care provider. This is important. This includes any further testing if your chest pain does not go away. °· If heartburn is the cause for your chest pain, you may be told to keep your head raised (elevated) while sleeping. This reduces the chance that acid will go from your stomach into your esophagus. °· Make lifestyle changes as directed by your health care provider. These may include: °¨ Getting regular exercise. Ask your health care provider to suggest some activities that are safe for you. °¨ Eating a heart-healthy diet. A registered dietitian can help you to learn healthy eating options. °¨ Maintaining a healthy weight. °¨ Managing diabetes, if necessary. °¨ Reducing stress. °SEEK MEDICAL CARE IF: °· Your chest pain does not go away after treatment. °· You have a rash with blisters on your chest. °· You have a fever. °SEEK IMMEDIATE MEDICAL CARE IF:  °· Your chest pain is worse. °· You have an increasing cough, or you cough up blood. °· You have severe abdominal pain. °· You have severe weakness. °· You faint. °· You have chills. °· You have sudden, unexplained chest discomfort. °· You have sudden, unexplained discomfort in your arms, back, neck, or jaw. °· You have shortness of breath at any time. °· You suddenly start to sweat, or your skin gets clammy. °· You feel nauseous or you vomit. °· You suddenly feel light-headed or dizzy. °· Your heart begins to beat quickly, or it feels like it is skipping beats. °These symptoms may represent a serious problem that is an emergency. Do not wait to see if the symptoms will go away. Get medical help right away. Call your local emergency services (911 in the [place] Do not drive yourself to the hospital. °  °This  information is not intended to replace advice given to you by your health care provider. Make sure you discuss any questions you have with your health care provider. °  °Document Released: 09/21/2005 Document Revised: 01/02/2015 Document Reviewed: 07/18/2014 °Elsevier Interactive Patient Education ©2016 [employer] ° °

## 2016-02-08 NOTE — ED Notes (Signed)
Pt c/o CP that began over 1 month ago.  Pt sts pain and SOB has gotten worse over time.  Pt sts she has not seen PCP or gone to urgent care.  Pt pushed to room in wheelchiar.  Pt sts she is ambulatory at home but will sometimes get SOB.  NAD. Pt urged to cal w/ concerns

## 2016-02-16 ENCOUNTER — Ambulatory Visit (INDEPENDENT_AMBULATORY_CARE_PROVIDER_SITE_OTHER): Payer: Medicare Other | Admitting: Cardiology

## 2016-02-16 ENCOUNTER — Encounter: Payer: Self-pay | Admitting: Cardiology

## 2016-02-16 VITALS — BP 114/74 | HR 57 | Ht 66.0 in | Wt 156.8 lb

## 2016-02-16 DIAGNOSIS — E785 Hyperlipidemia, unspecified: Secondary | ICD-10-CM | POA: Insufficient documentation

## 2016-02-16 DIAGNOSIS — F17210 Nicotine dependence, cigarettes, uncomplicated: Secondary | ICD-10-CM

## 2016-02-16 DIAGNOSIS — R0602 Shortness of breath: Secondary | ICD-10-CM | POA: Diagnosis not present

## 2016-02-16 DIAGNOSIS — I10 Essential (primary) hypertension: Secondary | ICD-10-CM

## 2016-02-16 DIAGNOSIS — F17200 Nicotine dependence, unspecified, uncomplicated: Secondary | ICD-10-CM

## 2016-02-16 DIAGNOSIS — Z720 Tobacco use: Secondary | ICD-10-CM

## 2016-02-16 DIAGNOSIS — R079 Chest pain, unspecified: Secondary | ICD-10-CM | POA: Diagnosis not present

## 2016-02-16 NOTE — Progress Notes (Signed)
Cardiology Office Note   Date:  02/16/2016   ID:  Dawn Byrd, Dawn Byrd 07-11-1956, MRN [id number]  Referring Doctor:  No PCP Per Patient. Pt was referred to our office by ED doctor Dr. Jene Every.  Pt sees Dr. Cliffton Asters   Cardiologist:   Almond Lint, MD   Reason for consultation:  Chief Complaint  Patient presents with  . other    Follow up from [facility] ER; chest pain. Meds reviewed by the patient verbally. Pt. c/o chest pain that radiates to her shoulders and down left arm/neck.        History of Present Illness: Dawn Byrd is a 60 y.o. female who presents for chest pain. She reports chest pain along with pain in the L arm, and L back of the shoulder. She reports that this starts as pain in the L arm that radiates to chest, L shoulder, and up the neck. She reports pain as heaviness feeling, 7/10 at the worst, constant throughout the day, waxing and waning, on going since 1 month ago, not exacerbated by anything in particular, she does not recall anything brought on the pain in the beginning.   She also reports: SOB with activity, together with pounding in chest when this happens (not racing heart beat, just strong heartbeat).   ROS:  Please see the history of present illness.   Negative for orthopnea, PND, edema, claudication, bleeding, headache, nausea, vomiting, fever, abd pain. All other systems are reviewed and negative.     Past Medical History  Diagnosis Date  . Hypertension   . HIV (human immunodeficiency virus infection) (HCC)   . Nerve disorder   . Chest pain, unspecified   . Hyperlipidemia     Past Surgical History  Procedure Laterality Date  . Rupture disc neck  12/2006  . Abdominal hysterectomy    . Bladder suspension       reports that she has been smoking Cigarettes.  She has a 30 pack-year smoking history. She has never used smokeless tobacco. She reports that she drinks about 1.2 oz of alcohol per week. She reports that she does not use illicit  drugs.   family history includes Atrial fibrillation in her father; CVA in her father; Other in her mother.   Current Outpatient Prescriptions  Medication Sig Dispense Refill  . atenolol (TENORMIN) 50 MG tablet Take 1 tablet (50 mg total) by mouth daily. 30 tablet 11  . atorvastatin (LIPITOR) 10 MG tablet Take 1 tablet (10 mg total) by mouth daily. 30 tablet 11  . citalopram (CELEXA) 40 MG tablet Take 1 tablet (40 mg total) by mouth daily. 30 tablet 11  . elvitegravir-cobicistat-emtricitabine-tenofovir (GENVOYA) 150-150-200-10 MG TABS tablet Take 1 tablet by mouth daily with breakfast. 30 tablet 11  . folic acid (FOLVITE) 400 MCG tablet Take 400 mcg by mouth daily.      . potassium chloride (KLOR-CON) 20 MEQ packet Take 20 mEq by mouth 2 (two) times daily.    . varenicline (CHANTIX PAK) 0.5 MG X 11 & 1 MG X 42 tablet Take one 0.5 mg tablet by mouth once daily for 3 days, then increase to one 0.5 mg tablet twice daily for 4 days, then increase to one 1 mg tablet twice daily. 53 tablet 1   Current Facility-Administered Medications  Medication Dose Route Frequency Provider Last Rate Last Dose  . pneumococcal 23 valent vaccine (PNU-IMMUNE) injection 0.5 mL  0.5 mL Intramuscular Once Carolin Guernsey, NP  Review of patient's allergies indicates no known allergies.    PHYSICAL EXAM: VS:  BP 114/74 mmHg  Pulse 57  Ht  (1.676 m)  Wt 156 lb 12 oz (71.101 kg)  BMI 25.31 kg/m2  SpO2 99% , BMI Body mass index is 25.31 kg/(m^2). GENERAL:  well developed, well nourished, overweight, not in acute distress HEENT: normocephalic, pink conjunctivae, anicteric sclerae, no xanthelasma, normal dentition, oropharynx clear NECK:  no neck vein engorgement, JVP normal, no hepatojugular reflux, carotid upstroke brisk and symmetric, no bruit, no thyromegaly, no lymphadenopathy LUNGS:  good respiratory effort, clear to auscultation bilaterally CV:  PMI not displaced, no thrills, no lifts, S1 and  S2 within normal limits, no palpable S3 or S4, no murmurs, no rubs, no gallops ABD:  Soft, nontender, nondistended, normoactive bowel sounds, no abdominal aortic bruit, no hepatomegaly, no splenomegaly MS: nontender back, no kyphosis, no scoliosis, no joint deformities EXT:  2+ DP/PT pulses, no edema, no varicosities, no cyanosis, no clubbing SKIN: warm, nondiaphoretic, normal turgor, no ulcers NEUROPSYCH: alert, oriented to person, place, and time, sensory/motor grossly intact, normal mood, appropriate affect     Recent Labs: 02/08/2016: ALT 20; BUN 12; Creatinine, Ser 0.83; Hemoglobin 15.3; Platelets 164; Potassium 4.0; Sodium 139    Lipid Panel    Component Value Date/Time   CHOL 134 05/14/2015 1129   TRIG 76 05/14/2015 1129   HDL 43* 05/14/2015 1129   CHOLHDL 3.1 05/14/2015 1129   VLDL 15 05/14/2015 1129   LDLCALC 76 05/14/2015 1129      Wt Readings from Last 3 Encounters:  02/16/16 156 lb 12 oz (71.101 kg)  02/02/16 152 lb 8 oz (69.174 kg)  06/02/15 143 lb 8 oz (65.091 kg)      Other studies Reviewed:  EKG:  EKG is ordered today. The ekg ordered today was personally reviewed by me and it reveals sinus bradycardia, 57 bpm. No ischemic changes, otherwise normal.   Additional studies/ records that were reviewed personally reviewed by me today include:  Stress test: ex treadmill test 04/11/2012, Dr. Mariah Milling ordered. Normal test. 8 mins. 105% MPHR. 10.1 METs.       ASSESSMENT AND PLAN:  1. Chest pain, atypical, felt together with L arm pain, L shoulder pain, L jaw pain. Non exertional. Pt has significant risk factors that warrant further work up - age, htn, hyperlipidemia, smoking. Rec continued monitoring, ex nuc stress test (also has SOB with exertion), and echo.  2. SOB with exertion - Rec rule out anginal equivalent with stresstesting. Rec echo. Discussed with pt that if these tests are unremarkable, rec ffup with PCP for pulmonary eval with PFTs, imaging. Pt  verbalized understanding.   3. HTN - BP is well controlled. Continue monitoring BP. Continue current medical therapy and lifestyle changes. Rec to obtain BP monitor.  4. Hyperlipidemia - Lipid levels wnl. Continue current medical therapy and lifestyle changes. Labs monitored by Dr. Orvan Falconer.   5. Tobacco use - Discussed importance of smoking cessation. Pt willing to try again. Used Chantix in past and quit for 6 months. She has not started Chantix prescribed by Dr. Orvan Falconer this time yet.   Current medicines are reviewed at length with the patient today.  The patient does not have concerns regarding medicines.  The following changes have been made:  no change  Labs/ tests ordered today include: Orders Placed This Encounter  Procedures  . NM Myocar Multi W/Spect W/Wall Motion / EF  . EKG 12-Lead  . Echocardiogram  I had a lengthy and detailed discussion with the patient regarding diagnoses, prognosis, diagnostic options, treatment options, and side effects of medications.   I counseled the patient on importance of lifestyle modification including heart healthy diet, regular physical activity, and smoking cessation.   Disposition:   FU with undersigned post tests.    Signed, Almond Lint, MD  02/16/2016 3:59 PM    [facility]

## 2016-02-16 NOTE — Patient Instructions (Addendum)
Medication Instructions:  Continue as before.  Do not take Atenolol the night before your stress test.  Labwork: None   Testing/Procedures: We will schedule a nuclear exercise stress test, and and echocardiogram. Please obtain a BP monitor so you can monitor your BP. Your physician has requested that you have an echocardiogram. Echocardiography is a painless test that uses sound waves to create images of your heart. It provides your doctor with information about the size and shape of your heart and how well your heart's chambers and valves are working. This procedure takes approximately one hour. There are no restrictions for this procedure.  Your physician has requested that you have a lexiscan myoview. For further information please visit [email or web address] Please follow instruction sheet, as given.  Date & Arrival time: 02/17/2016 at 08:15 AM  [facility] Entrance Please check in at registration. Nothing to eat or drink after midnight Wear comfortable clothing No lotions or cologne on chest area Hold Atenolol the night before and morning of your test No caffeine 12 hours prior to appointment No smoking until procedure.  Follow-Up: Follow up in the office once tests are done.   If you need a refill on your cardiac medications before your next appointment, please call your pharmacy.Cardiac Nuclear Scanning A cardiac nuclear scan is used to check your heart for problems, such as the following:  A portion of the heart is not getting enough blood.  Part of the heart muscle has died, which happens with a heart attack.  The heart wall is not working normally.  In this test, a radioactive dye (tracer) is injected into your bloodstream. After the tracer has traveled to your heart, a scanning device is used to measure how much of the tracer is absorbed by or distributed to various areas of your heart. LET [facility] CARE PROVIDER KNOW ABOUT:  Any allergies you have.  All  medicines you are taking, including vitamins, herbs, eye drops, creams, and over-the-counter medicines.  Previous problems you or members of your family have had with the use of anesthetics.  Any blood disorders you have.  Previous surgeries you have had.  Medical conditions you have.  RISKS AND COMPLICATIONS Generally, this is a safe procedure. However, as with any procedure, problems can occur. Possible problems include:   Serious chest pain.  Rapid heartbeat.  Sensation of warmth in your chest. This usually passes quickly. BEFORE THE PROCEDURE Ask your health care provider about changing or stopping your regular medicines. PROCEDURE This procedure is usually done at a hospital and takes 2-4 hours.  An IV tube is inserted into one of your veins.  Your health care provider will inject a small amount of radioactive tracer through the tube.  You will then wait for 20-40 minutes while the tracer travels through your bloodstream.  You will lie down on an exam table so images of your heart can be taken. Images will be taken for about 15-20 minutes.  You will exercise on a treadmill or stationary bike. While you exercise, your heart activity will be monitored with an electrocardiogram (ECG), and your blood pressure will be checked.  If you are unable to exercise, you may be given a medicine to make your heart beat faster.  When blood flow to your heart has peaked, tracer will again be injected through the IV tube.  After 20-40 minutes, you will get back on the exam table and have more images taken of your heart.  When the procedure is over,  your IV tube will be removed. AFTER THE PROCEDURE  You will likely be able to leave shortly after the test. Unless your health care provider tells you otherwise, you may return to your normal schedule, including diet, activities, and medicines.  Make sure you find out how and when you will get your test results.   This information is not  intended to replace advice given to you by your health care provider. Make sure you discuss any questions you have with your health care provider.   Document Released: 01/06/2005 Document Revised: 12/17/2013 Document Reviewed: 11/20/2013 Elsevier Interactive Patient Education [employer] Echocardiogram An echocardiogram, or echocardiography, uses sound waves (ultrasound) to produce an image of your heart. The echocardiogram is simple, painless, obtained within a short period of time, and offers valuable information to your health care provider. The images from an echocardiogram can provide information such as:  Evidence of coronary artery disease (CAD).  Heart size.  Heart muscle function.  Heart valve function.  Aneurysm detection.  Evidence of a past heart attack.  Fluid buildup around the heart.  Heart muscle thickening.  Assess heart valve function. LET [facility] CARE PROVIDER KNOW ABOUT:  Any allergies you have.  All medicines you are taking, including vitamins, herbs, eye drops, creams, and over-the-counter medicines.  Previous problems you or members of your family have had with the use of anesthetics.  Any blood disorders you have.  Previous surgeries you have had.  Medical conditions you have.  Possibility of pregnancy, if this applies. BEFORE THE PROCEDURE  No special preparation is needed. Eat and drink normally.  PROCEDURE   In order to produce an image of your heart, gel will be applied to your chest and a wand-like tool (transducer) will be moved over your chest. The gel will help transmit the sound waves from the transducer. The sound waves will harmlessly bounce off your heart to allow the heart images to be captured in real-time motion. These images will then be recorded.  You may need an IV to receive a medicine that improves the quality of the pictures. AFTER THE PROCEDURE You may return to your normal schedule including diet, activities,  and medicines, unless your health care provider tells you otherwise.   This information is not intended to replace advice given to you by your health care provider. Make sure you discuss any questions you have with your health care provider.   Document Released: 12/09/2000 Document Revised: 01/02/2015 Document Reviewed: 08/19/2013 Elsevier Interactive Patient Education [employer]

## 2016-02-17 ENCOUNTER — Encounter
Admission: RE | Admit: 2016-02-17 | Discharge: 2016-02-17 | Disposition: A | Discharge status: Home / Self Care | Payer: Medicare Other | Source: Ambulatory Visit | Attending: Cardiology | Admitting: Cardiology

## 2016-02-17 DIAGNOSIS — R079 Chest pain, unspecified: Secondary | ICD-10-CM | POA: Insufficient documentation

## 2016-02-17 LAB — NM MYOCAR MULTI W/SPECT W/WALL MOTION / EF
CHL CUP NUCLEAR SDS: 2
CHL CUP NUCLEAR SSS: 2
CHL CUP RESTING HR STRESS: 59 {beats}/min
CHL CUP STRESS STAGE 1 HR: 69 {beats}/min
CHL CUP STRESS STAGE 2 GRADE: 0 %
CHL CUP STRESS STAGE 2 SPEED: 0 mph
CHL CUP STRESS STAGE 3 GRADE: 0 %
CHL CUP STRESS STAGE 3 HR: 67 {beats}/min
CHL CUP STRESS STAGE 3 SPEED: 0 mph
CHL CUP STRESS STAGE 4 HR: 93 {beats}/min
CHL CUP STRESS STAGE 4 SPEED: 0 mph
CHL CUP STRESS STAGE 5 GRADE: 0 %
CHL CUP STRESS STAGE 5 SPEED: 0 mph
CHL CUP STRESS STAGE 6 GRADE: 0 %
CHL CUP STRESS STAGE 6 HR: 71 {beats}/min
CHL CUP STRESS STAGE 6 SBP: 132 mmHg
CSEPHR: 69 %
CSEPPMHR: 57 %
Estimated workload: 1 METS
Exercise duration (min): 6 min
Exercise duration (sec): 0 s
LV dias vol: 53 mL
LV sys vol: 23 mL
MPHR: 163 {beats}/min
NUC STRESS TID: 1.03
Peak HR: 93 {beats}/min
SRS: 8
Stage 2 HR: 68 {beats}/min
Stage 4 Grade: 0 %
Stage 5 HR: 88 {beats}/min
Stage 6 DBP: 70 mmHg
Stage 6 Speed: 0 mph

## 2016-02-17 MED ORDER — REGADENOSON 0.4 MG/5ML IV SOLN
0.4000 mg | Freq: Once | INTRAVENOUS | Status: AC
  Administered 2016-02-17: 0.4 mg via INTRAVENOUS

## 2016-02-17 MED ORDER — TECHNETIUM TC 99M SESTAMIBI - CARDIOLITE
13.9000 | Freq: Once | INTRAVENOUS | Status: AC | PRN
  Administered 2016-02-17: 13.9 via INTRAVENOUS

## 2016-02-17 MED ORDER — TECHNETIUM TC 99M SESTAMIBI - CARDIOLITE
32.7800 | Freq: Once | INTRAVENOUS | Status: AC | PRN
  Administered 2016-02-17: 32.78 via INTRAVENOUS

## 2016-02-19 ENCOUNTER — Telehealth: Payer: Self-pay | Admitting: *Deleted

## 2016-02-19 ENCOUNTER — Other Ambulatory Visit: Payer: Self-pay | Admitting: *Deleted

## 2016-02-19 MED ORDER — ASPIRIN EC 81 MG PO TBEC: 81.0000 mg | DELAYED_RELEASE_TABLET | Freq: Every day | ORAL | Status: DC

## 2016-02-19 MED ORDER — NITROGLYCERIN 0.4 MG SL SUBL: 0.4000 mg | SUBLINGUAL_TABLET | SUBLINGUAL | Status: AC | PRN

## 2016-02-19 NOTE — Telephone Encounter (Signed)
Spoke with patient and let her know per Dr. Alvino Chapel to start taking a baby aspirin 81 mg daily and that we also sent in a prescription for nitroglycerin 0.4 mg to take as directed. Instructed her that if she takes 3 in a row and has no relief to go to the closest emergency room and Dr. Alvino Chapel said that if she has any severe chest pain with exertion to also go to the ED. Let her know that we would review all test results with her on Tuesday 2/28 at her appointment. She verbalized understanding of instructions.

## 2016-02-22 ENCOUNTER — Other Ambulatory Visit: Payer: Medicare Other

## 2016-02-23 ENCOUNTER — Ambulatory Visit: Payer: Medicare Other | Admitting: Cardiology

## 2016-03-03 ENCOUNTER — Ambulatory Visit: Payer: Medicare Other

## 2016-03-03 ENCOUNTER — Other Ambulatory Visit: Payer: Self-pay

## 2016-03-03 DIAGNOSIS — R079 Chest pain, unspecified: Secondary | ICD-10-CM

## 2016-03-08 ENCOUNTER — Other Ambulatory Visit: Payer: Self-pay | Admitting: Cardiology

## 2016-03-08 ENCOUNTER — Ambulatory Visit (INDEPENDENT_AMBULATORY_CARE_PROVIDER_SITE_OTHER): Payer: Medicare Other | Admitting: Cardiology

## 2016-03-08 ENCOUNTER — Ambulatory Visit: Payer: Medicare Other | Admitting: Cardiology

## 2016-03-08 ENCOUNTER — Encounter: Payer: Self-pay | Admitting: Cardiology

## 2016-03-08 ENCOUNTER — Telehealth: Payer: Self-pay | Admitting: Cardiology

## 2016-03-08 VITALS — BP 122/88 | HR 58 | Ht 66.0 in | Wt 157.8 lb

## 2016-03-08 DIAGNOSIS — R079 Chest pain, unspecified: Secondary | ICD-10-CM

## 2016-03-08 DIAGNOSIS — R0602 Shortness of breath: Secondary | ICD-10-CM

## 2016-03-08 DIAGNOSIS — I10 Essential (primary) hypertension: Secondary | ICD-10-CM | POA: Diagnosis not present

## 2016-03-08 DIAGNOSIS — Z01812 Encounter for preprocedural laboratory examination: Secondary | ICD-10-CM | POA: Diagnosis not present

## 2016-03-08 NOTE — Telephone Encounter (Signed)
Called to provide patient with information regarding cardiac catheterization. Scheduled procedure for 03/23/2016 at 12:30PM and reviewed instructions for procedure. Patient verbalized understanding and had no further questions.

## 2016-03-08 NOTE — Patient Instructions (Addendum)
Medication Instructions:  Your physician recommends that you continue on your current medications as directed. Please refer to the Current Medication list given to you today.   Labwork: Your physician recommends that you return for lab work when you pre-register for procedure. CBC, PT/INR, BMP  Date & Time: ______________________________________________________________   Testing/Procedures: Your physician has requested that you have a cardiac catheterization. Cardiac catheterization is used to diagnose and/or treat various heart conditions. Doctors may recommend this procedure for a number of different reasons. The most common reason is to evaluate chest pain. Chest pain can be a symptom of coronary artery disease (CAD), and cardiac catheterization can show whether plaque is narrowing or blocking your heart's arteries. This procedure is also used to evaluate the valves, as well as measure the blood flow and oxygen levels in different parts of your heart. For further information please visit [email or web address] Please follow instruction sheet, as given.  [facility] Cardiac Cath Instructions   You are scheduled for a Cardiac Cath on:_________________________  Please arrive at _______am on the day of your procedure  You will need to pre-register prior to the day of your procedure.  Enter through the [employer] at [facility].  Registration is the first desk on your right.  Please take the procedure order we have given you in order to be registered appropriately  Do not eat/drink anything after midnight  Someone will need to drive you home  It is recommended someone be with you for the first 24 hours after your procedure  Wear clothes that are easy to get on/off and wear slip on shoes if possible   Medications bring a current list of all medications with you  ___ You may take all of your medications the morning of your procedure with enough water to swallow safely  ___ Do not take these  medications before your procedure:_______________________________________________________________________________________________________________________________________________________________________________________________________________   Day of your procedure: Arrive at the Medical Mall entrance.  Free valet service is available.  After entering the Medical Mall please check-in at the registration desk (1st desk on your right) to receive your armband. After receiving your armband someone will escort you to the cardiac cath/special procedures waiting area.  The usual length of stay after your procedure is about 2 to 3 hours.  This can vary.  If you have any questions, please call our office at [phone], or you may call the cardiac cath lab at [facility] directly at [phone]   Follow-Up: Your physician recommends that you schedule a follow-up appointment in: 1 week following cardiac cath.  Date & Time: __________________________________________________    Any Other Special Instructions Will Be Listed Below (If Applicable).     If you need a refill on your cardiac medications before your next appointment, please call your pharmacy.  Angiogram An angiogram, also called angiography, is a procedure used to look at the blood vessels. In this procedure, dye is injected through a long, thin tube (catheter) into an artery. X-rays are then taken. The X-rays will show if there is a blockage or problem in a blood vessel.  LET [facility] CARE PROVIDER KNOW ABOUT:  Any allergies you have, including allergies to shellfish or contrast dye.   All medicines you are taking, including vitamins, herbs, eye drops, creams, and over-the-counter medicines.   Previous problems you or members of your family have had with the use of anesthetics.   Any blood disorders you have.   Previous surgeries you have had.  Any previous kidney problems or failure you  have had.  Medical conditions you  have.   Possibility of pregnancy, if this applies. RISKS AND COMPLICATIONS Generally, an angiogram is a safe procedure. However, as with any procedure, problems can occur. Possible problems include:  Injury to the blood vessels, including rupture or bleeding.  Infection or bruising at the catheter site.  Allergic reaction to the dye or contrast used.  Kidney damage from the dye or contrast used.  Blood clots that can lead to a stroke or heart attack. BEFORE THE PROCEDURE  Do not eat or drink after midnight on the night before the procedure, or as directed by your health care provider.   Ask your health care provider if you may drink enough water to take any needed medicines the morning of the procedure.  PROCEDURE  You may be given a medicine to help you relax (sedative) before and during the procedure. This medicine is given through an IV access tube that is inserted into one of your veins.   The area where the catheter will be inserted will be washed and shaved. This is usually done in the groin but may be done in the fold of your arm (near your elbow) or in the wrist.  A medicine will be given to numb the area where the catheter will be inserted (local anesthetic).  The catheter will be inserted with a guide wire into an artery. The catheter is guided by using a type of X-ray (fluoroscopy) to the blood vessel being examined.   Dye is then injected into the catheter, and X-rays are taken. The dye helps to show where any narrowing or blockages are located.  AFTER THE PROCEDURE   If the procedure is done through the leg, you will be kept in bed lying flat for several hours. You will be instructed to not bend or cross your legs.  The insertion site will be checked frequently.  The pulse in your feet or wrist will be checked frequently.  Additional blood tests, X-rays, and electrocardiography may be done.   You may need to stay in the hospital overnight for observation.     This information is not intended to replace advice given to you by your health care provider. Make sure you discuss any questions you have with your health care provider.   Document Released: 09/21/2005 Document Revised: 01/02/2015 Document Reviewed: 05/15/2013 Elsevier Interactive Patient Education 2016 Else[employer] Inc.    Angiogram, Care After These instructions give you information about caring for yourself after your procedure. Your doctor may also give you more specific instructions. Call your doctor if you have any problems or questions after your procedure.  HOME CARE  Take medicines only as told by your doctor.  Follow your doctor's instructions about:  Care of the area where the tube was inserted.  Bandage (dressing) changes and removal.  You may shower 24-48 hours after the procedure or as told by your doctor.  Do not take baths, swim, or use a hot tub until your doctor approves.  Every day, check the area where the tube was inserted. Watch for:  Redness, swelling, or pain.  Fluid, blood, or pus.  Do not apply powder or lotion to the site.  Do not lift anything that is heavier than 10 lb (4.5 kg) for 5 days or as told by your doctor.  Ask your doctor when you can:  Return to work or school.  Do physical activities or play sports.  Have sex.  Do not drive or operate heavy  machinery for 24 hours or as told by your doctor.  Have someone with you for the first 24 hours after the procedure.  Keep all follow-up visits as told by your doctor. This is important. GET HELP IF:  You have a fever.   You have chills.   You have more bleeding from the area where the tube was inserted. Hold pressure on the area.  You have redness, swelling, or pain in the area where the tube was inserted.  You have fluid or pus coming from the area. GET HELP RIGHT AWAY IF:   You have a lot of pain in the area where the tube was inserted.  The area where the tube was  inserted is bleeding, and the bleeding does not stop after 30 minutes of holding steady pressure on the area.  The area near or just beyond the insertion site becomes pale, cool, tingly, or numb.   This information is not intended to replace advice given to you by your health care provider. Make sure you discuss any questions you have with your health care provider.   Document Released: 03/10/2009 Document Revised: 01/02/2015 Document Reviewed: 05/15/2013 Elsevier Interactive Patient Education [employer]2016 [employer] Inc[employer]

## 2016-03-08 NOTE — Progress Notes (Signed)
  Cardiology Office Note   Date:  03/08/2016   ID:  Dawn Byrd, DOB 58/12/1955, MRN [id number]  Referring Doctor:  No PCP Per Patient. Pt was referred to our office by ED doctor Dr. Robert Kinner.  Pt sees Dr. John Campbell   Cardiologist:   Natalyia Innes, MD   Reason for consultation:  Chief Complaint  Patient presents with  . Chest Pain    follow up after stress test      History of Present Illness: Dawn Byrd is a 60 y.o. female who presents for Follow-up after stress test. It was ordered for chest pain, left arm pain and left back of the shoulder pain. Since her last visit, she has had no significant recurrence of her symptoms.  She also reported SOB with activity, together with pounding in chest when this happens (not racing heart beat, just strong heartbeat). She continues to have some shortness of breath.  ROS:  Please see the history of present illness.   Negative for orthopnea, PND, edema, claudication, bleeding, headache, nausea, vomiting, fever, abd pain. All other systems are reviewed and negative.     Past Medical History  Diagnosis Date  . Hypertension   . HIV (human immunodeficiency virus infection) (HCC)   . Nerve disorder   . Chest pain, unspecified   . Hyperlipidemia     Past Surgical History  Procedure Laterality Date  . Rupture disc neck  12/2006  . Abdominal hysterectomy    . Bladder suspension       reports that she has been smoking Cigarettes.  She has a 30 pack-year smoking history. She has never used smokeless tobacco. She reports that she drinks about 1.2 oz of alcohol per week. She reports that she does not use illicit drugs.   family history includes Atrial fibrillation in her father; CVA in her father; Other in her mother.   Current Outpatient Prescriptions  Medication Sig Dispense Refill  . aspirin EC 81 MG tablet Take 1 tablet (81 mg total) by mouth daily.    . atenolol (TENORMIN) 50 MG tablet Take 1 tablet (50 mg total) by mouth  daily. 30 tablet 11  . atorvastatin (LIPITOR) 10 MG tablet Take 1 tablet (10 mg total) by mouth daily. 30 tablet 11  . citalopram (CELEXA) 40 MG tablet Take 1 tablet (40 mg total) by mouth daily. 30 tablet 11  . elvitegravir-cobicistat-emtricitabine-tenofovir (GENVOYA) 150-150-200-10 MG TABS tablet Take 1 tablet by mouth daily with breakfast. 30 tablet 11  . folic acid (FOLVITE) 400 MCG tablet Take 400 mcg by mouth daily.      . nitroGLYCERIN (NITROSTAT) 0.4 MG SL tablet Place 1 tablet (0.4 mg total) under the tongue every 5 (five) minutes as needed for chest pain. 25 tablet 3  . potassium chloride (KLOR-CON) 20 MEQ packet Take 20 mEq by mouth 2 (two) times daily.    . varenicline (CHANTIX PAK) 0.5 MG X 11 & 1 MG X 42 tablet Take one 0.5 mg tablet by mouth once daily for 3 days, then increase to one 0.5 mg tablet twice daily for 4 days, then increase to one 1 mg tablet twice daily. (Patient not taking: Reported on 03/08/2016) 53 tablet 1   Current Facility-Administered Medications  Medication Dose Route Frequency Provider Last Rate Last Dose  . pneumococcal 23 valent vaccine (PNU-IMMUNE) injection 0.5 mL  0.5 mL Intramuscular Once Bradford A Farrington, NP        Review of patient's allergies indicates no   known allergies.    PHYSICAL EXAM: VS:  BP 122/88 mmHg  Pulse 58  Ht 5' 6" (1.676 m)  Wt 157 lb 12.8 oz (71.578 kg)  BMI 25.48 kg/m2 , BMI Body mass index is 25.48 kg/(m^2). GENERAL:  well developed, well nourished, overweight, not in acute distress HEENT: normocephalic, pink conjunctivae, anicteric sclerae, no xanthelasma, normal dentition, oropharynx clear NECK:  no neck vein engorgement, JVP normal, no hepatojugular reflux, carotid upstroke brisk and symmetric, no bruit, no thyromegaly, no lymphadenopathy LUNGS:  good respiratory effort, clear to auscultation bilaterally CV:  PMI not displaced, no thrills, no lifts, S1 and S2 within normal limits, no palpable S3 or S4, no murmurs, no  rubs, no gallops ABD:  Soft, nontender, nondistended, normoactive bowel sounds, no abdominal aortic bruit, no hepatomegaly, no splenomegaly MS: nontender back, no kyphosis, no scoliosis, no joint deformities EXT:  2+ DP/PT pulses, no edema, no varicosities, no cyanosis, no clubbing SKIN: warm, nondiaphoretic, normal turgor, no ulcers NEUROPSYCH: alert, oriented to person, place, and time, sensory/motor grossly intact, normal mood, appropriate affect     Recent Labs: 02/08/2016: ALT 20; BUN 12; Creatinine, Ser 0.83; Hemoglobin 15.3; Platelets 164; Potassium 4.0; Sodium 139    Lipid Panel    Component Value Date/Time   CHOL 134 05/14/2015 1129   TRIG 76 05/14/2015 1129   HDL 43* 05/14/2015 1129   CHOLHDL 3.1 05/14/2015 1129   VLDL 15 05/14/2015 1129   LDLCALC 76 05/14/2015 1129      Wt Readings from Last 3 Encounters:  03/08/16 157 lb 12.8 oz (71.578 kg)  02/16/16 156 lb 12 oz (71.101 kg)  02/02/16 152 lb 8 oz (69.174 kg)      Other studies Reviewed:  EKG:  The ekg From 02/16/2016 was personally reviewed by me and it revealed sinus bradycardia, 57 bpm. No ischemic changes, otherwise normal.   Additional studies/ records that were reviewed personally reviewed by me today include:  Stress test: ex treadmill test 04/11/2012, Dr. Gollan ordered. Normal test. 8 mins. 105% MPHR. 10.1 METs.   Exercise nuclear stress test 02/17/2016:  Defect 1: There is a small defect of mild severity present in the basal anterolateral, mid anterior and mid anterolateral location - the defect is reversible & therefore consider anterior ischemia.  Cannot exclude shifting breast attenuation.  The left ventricular ejection fraction is hyperdynamic (>65%).  Very poor exercise tolerance.  There was no ST segment deviation noted during stress. No T wave inversion was noted during stress. The patient did very poorly on the TM - consistent with very poor exercise tolerance.  Images suggest a  potential (very faint) mid-apical Anterior-anterolateral diffuse perfusion effect that has the appearance of breast attenuation (but not seen in rest images). Cannot exclude ischemia. Defer to clinical presentation.     ASSESSMENT AND PLAN:  Chest pain SOB with exertion Pt has significant risk factors that warranted further work up - age, htn, hyperlipidemia, smoking.  Discussed in detail findings of exercise nuclear stress test from 02/17/2016. Although the abnormality is small, she continues to have shortness of breath with exertion. Discussed recommendation of proceeding with cardiac catheterization. Risks and benefits of cardiac catheterization have been discussed with the patient.  These include less than 1% chance of major complications including but not limited to bleeding, infection, kidney damage, arrhyhthmia, heart attack, stroke, and death.  The patient understands these risks and is willing to proceed. Patient prescribed ASA, and NTG SL prn for chest pain. Patient instructed to call [phone]   for unrelenting chest pain.    SOB with exertion  As discussed above. . After cardiac catheterization, Rec ffup with PCP for pulmonary eval with PFTs, imaging. Pt verbalized understanding.   HTN - BP is well controlled. Continue monitoring BP. Continue current medical therapy and lifestyle changes. Rec to obtain BP monitor.  Current medicines are reviewed at length with the patient today.  The patient does not have concerns regarding medicines.  The following changes have been made:  no change  Labs/ tests ordered today include: Orders Placed This Encounter  Procedures  . CBC with Differential/Platelet  . Basic Metabolic Panel (BMET)  . INR/PT    I had a lengthy and detailed discussion with the patient regarding diagnoses, prognosis, diagnostic options, treatment options, and side effects of medications.   I counseled the patient on importance of lifestyle modification including  heart healthy diet, regular physical activity, and smoking cessation.   Disposition:   FU with undersigned after procedure Signed, Titianna Loomis, MD  03/08/2016 12:04 PM    [facility]    

## 2016-03-16 ENCOUNTER — Other Ambulatory Visit: Payer: Medicare Other

## 2016-03-22 ENCOUNTER — Other Ambulatory Visit
Admission: RE | Admit: 2016-03-22 | Discharge: 2016-03-22 | Disposition: A | Discharge status: Home / Self Care | Payer: Medicare Other | Source: Ambulatory Visit | Attending: Cardiology | Admitting: Cardiology

## 2016-03-22 DIAGNOSIS — R079 Chest pain, unspecified: Secondary | ICD-10-CM | POA: Diagnosis present

## 2016-03-22 DIAGNOSIS — Z01812 Encounter for preprocedural laboratory examination: Secondary | ICD-10-CM | POA: Insufficient documentation

## 2016-03-22 DIAGNOSIS — R0602 Shortness of breath: Secondary | ICD-10-CM | POA: Insufficient documentation

## 2016-03-22 LAB — BASIC METABOLIC PANEL
ANION GAP: 7 (ref 5–15)
BUN: 13 mg/dL (ref 6–20)
CALCIUM: 8.9 mg/dL (ref 8.9–10.3)
CO2: 25 mmol/L (ref 22–32)
Chloride: 105 mmol/L (ref 101–111)
Creatinine, Ser: 0.84 mg/dL (ref 0.44–1.00)
GFR calc Af Amer: >60 mL/min (ref >60)
GLUCOSE: 89 mg/dL (ref 65–99)
Potassium: 4.2 mmol/L (ref 3.5–5.1)
SODIUM: 137 mmol/L (ref 135–145)

## 2016-03-22 LAB — CBC WITH DIFFERENTIAL/PLATELET
BASOS ABS: 0 10*3/uL (ref 0–0.1)
Basophils Relative: 0 %
EOS ABS: 0.1 10*3/uL (ref 0–0.7)
EOS PCT: 2 %
HCT: 42.8 % (ref 35.0–47.0)
Hemoglobin: 14.7 g/dL (ref 12.0–16.0)
LYMPHS PCT: 43 %
Lymphs Abs: 1.6 10*3/uL (ref 1.0–3.6)
MCH: 32.5 pg (ref 26.0–34.0)
MCHC: 34.2 g/dL (ref 32.0–36.0)
MCV: 95.1 fL (ref 80.0–100.0)
MONO ABS: 0.3 10*3/uL (ref 0.2–0.9)
Monocytes Relative: 9 %
Neutro Abs: 1.8 10*3/uL (ref 1.4–6.5)
Neutrophils Relative %: 46 %
PLATELETS: 157 10*3/uL (ref 150–440)
RBC: 4.51 MIL/uL (ref 3.80–5.20)
RDW: 13.4 % (ref 11.5–14.5)
WBC: 3.8 10*3/uL (ref 3.6–11.0)

## 2016-03-22 LAB — PROTIME-INR
INR: 1
PROTHROMBIN TIME: 13.4 s (ref 11.4–15.0)

## 2016-03-23 ENCOUNTER — Encounter: Payer: Self-pay | Admitting: Certified Registered Nurse Anesthetist

## 2016-03-23 ENCOUNTER — Ambulatory Visit
Admission: RE | Admit: 2016-03-23 | Discharge: 2016-03-23 | Disposition: A | Discharge status: Home / Self Care | Payer: Medicare Other | Source: Ambulatory Visit | Attending: Cardiology | Admitting: Cardiology

## 2016-03-23 ENCOUNTER — Encounter
Admission: RE | Disposition: A | Discharge status: Home / Self Care | Payer: Self-pay | Source: Ambulatory Visit | Attending: Cardiology

## 2016-03-23 ENCOUNTER — Encounter: Payer: Self-pay | Admitting: *Deleted

## 2016-03-23 DIAGNOSIS — I959 Hypotension, unspecified: Secondary | ICD-10-CM | POA: Diagnosis not present

## 2016-03-23 DIAGNOSIS — Z79899 Other long term (current) drug therapy: Secondary | ICD-10-CM | POA: Insufficient documentation

## 2016-03-23 DIAGNOSIS — Z90710 Acquired absence of both cervix and uterus: Secondary | ICD-10-CM | POA: Insufficient documentation

## 2016-03-23 DIAGNOSIS — R931 Abnormal findings on diagnostic imaging of heart and coronary circulation: Secondary | ICD-10-CM | POA: Diagnosis not present

## 2016-03-23 DIAGNOSIS — I498 Other specified cardiac arrhythmias: Secondary | ICD-10-CM | POA: Diagnosis not present

## 2016-03-23 DIAGNOSIS — Z23 Encounter for immunization: Secondary | ICD-10-CM | POA: Insufficient documentation

## 2016-03-23 DIAGNOSIS — R0602 Shortness of breath: Secondary | ICD-10-CM | POA: Diagnosis present

## 2016-03-23 DIAGNOSIS — E785 Hyperlipidemia, unspecified: Secondary | ICD-10-CM | POA: Diagnosis not present

## 2016-03-23 DIAGNOSIS — Z8249 Family history of ischemic heart disease and other diseases of the circulatory system: Secondary | ICD-10-CM | POA: Insufficient documentation

## 2016-03-23 DIAGNOSIS — I10 Essential (primary) hypertension: Secondary | ICD-10-CM | POA: Diagnosis not present

## 2016-03-23 DIAGNOSIS — R9439 Abnormal result of other cardiovascular function study: Secondary | ICD-10-CM | POA: Diagnosis present

## 2016-03-23 DIAGNOSIS — M79602 Pain in left arm: Secondary | ICD-10-CM | POA: Diagnosis not present

## 2016-03-23 DIAGNOSIS — R079 Chest pain, unspecified: Secondary | ICD-10-CM | POA: Diagnosis present

## 2016-03-23 DIAGNOSIS — M25512 Pain in left shoulder: Secondary | ICD-10-CM | POA: Diagnosis not present

## 2016-03-23 DIAGNOSIS — Z21 Asymptomatic human immunodeficiency virus [HIV] infection status: Secondary | ICD-10-CM | POA: Diagnosis not present

## 2016-03-23 DIAGNOSIS — Z7982 Long term (current) use of aspirin: Secondary | ICD-10-CM | POA: Diagnosis not present

## 2016-03-23 DIAGNOSIS — Z823 Family history of stroke: Secondary | ICD-10-CM | POA: Insufficient documentation

## 2016-03-23 DIAGNOSIS — R0609 Other forms of dyspnea: Secondary | ICD-10-CM | POA: Insufficient documentation

## 2016-03-23 HISTORY — PX: CARDIAC CATHETERIZATION: SHX172

## 2016-03-23 SURGERY — LEFT HEART CATH AND CORONARY ANGIOGRAPHY
Anesthesia: Moderate Sedation | Laterality: Left

## 2016-03-23 MED ORDER — MORPHINE SULFATE (PF) 2 MG/ML IV SOLN: 2.0000 mg | INTRAVENOUS | Status: DC | PRN

## 2016-03-23 MED ORDER — SODIUM CHLORIDE 0.9% FLUSH: 3.0000 mL | INTRAVENOUS | Status: DC | PRN

## 2016-03-23 MED ORDER — MIDAZOLAM HCL 2 MG/2ML IJ SOLN
INTRAMUSCULAR | Status: DC | PRN
  Administered 2016-03-23: 1 mg via INTRAVENOUS

## 2016-03-23 MED ORDER — ASPIRIN 81 MG PO CHEW
CHEWABLE_TABLET | ORAL | Status: AC
  Filled 2016-03-23: qty 1

## 2016-03-23 MED ORDER — HEPARIN SODIUM (PORCINE) 1000 UNIT/ML IJ SOLN
INTRAMUSCULAR | Status: AC
  Filled 2016-03-23: qty 1

## 2016-03-23 MED ORDER — SODIUM CHLORIDE 0.9 % WEIGHT BASED INFUSION: 1.0000 mL/kg/h | INTRAVENOUS | Status: DC

## 2016-03-23 MED ORDER — SODIUM CHLORIDE 0.9 % IV SOLN: 250.0000 mL | INTRAVENOUS | Status: DC | PRN

## 2016-03-23 MED ORDER — NITROGLYCERIN 5 MG/ML IV SOLN
INTRAVENOUS | Status: AC
  Filled 2016-03-23: qty 10

## 2016-03-23 MED ORDER — NITROGLYCERIN 1 MG/10 ML FOR IR/CATH LAB
INTRA_ARTERIAL | Status: DC | PRN
  Administered 2016-03-23: 200 ug via INTRACORONARY

## 2016-03-23 MED ORDER — HEPARIN (PORCINE) IN NACL 2-0.9 UNIT/ML-% IJ SOLN
INTRAMUSCULAR | Status: AC
  Filled 2016-03-23: qty 500

## 2016-03-23 MED ORDER — ASPIRIN 81 MG PO CHEW: 81.0000 mg | CHEWABLE_TABLET | ORAL | Status: DC

## 2016-03-23 MED ORDER — ONDANSETRON HCL 4 MG/2ML IJ SOLN: 4.0000 mg | Freq: Four times a day (QID) | INTRAMUSCULAR | Status: DC | PRN

## 2016-03-23 MED ORDER — SODIUM CHLORIDE 0.9 % IV BOLUS (SEPSIS)
INTRAVENOUS | Status: DC | PRN
Start: 2016-03-23 — End: 2016-03-23
  Administered 2016-03-23: 400 mL via INTRAVENOUS

## 2016-03-23 MED ORDER — SODIUM CHLORIDE 0.9 % IV SOLN
INTRAVENOUS | Status: DC
  Administered 2016-03-23: 13:00:00 via INTRAVENOUS

## 2016-03-23 MED ORDER — FENTANYL CITRATE (PF) 100 MCG/2ML IJ SOLN
INTRAMUSCULAR | Status: AC
  Filled 2016-03-23: qty 2

## 2016-03-23 MED ORDER — VERAPAMIL HCL 2.5 MG/ML IV SOLN
INTRAVENOUS | Status: AC
  Filled 2016-03-23: qty 2

## 2016-03-23 MED ORDER — SODIUM CHLORIDE 0.9% FLUSH
3.0000 mL | Freq: Two times a day (BID) | INTRAVENOUS | Status: DC
Start: 2016-03-23 — End: 2016-03-23

## 2016-03-23 MED ORDER — MIDAZOLAM HCL 2 MG/2ML IJ SOLN
INTRAMUSCULAR | Status: AC
  Filled 2016-03-23: qty 2

## 2016-03-23 MED ORDER — IOPAMIDOL (ISOVUE-300) INJECTION 61%
INTRAVENOUS | Status: DC | PRN
  Administered 2016-03-23: 100 mL via INTRA_ARTERIAL

## 2016-03-23 MED ORDER — FENTANYL CITRATE (PF) 100 MCG/2ML IJ SOLN
INTRAMUSCULAR | Status: DC | PRN
  Administered 2016-03-23: 25 ug via INTRAVENOUS

## 2016-03-23 MED ORDER — ACETAMINOPHEN 325 MG PO TABS
650.0000 mg | ORAL_TABLET | ORAL | Status: DC | PRN
Start: 2016-03-23 — End: 2016-03-23

## 2016-03-23 MED ORDER — SODIUM CHLORIDE 0.9% FLUSH: 3.0000 mL | Freq: Two times a day (BID) | INTRAVENOUS | Status: DC

## 2016-03-23 SURGICAL SUPPLY — 13 items
CATH 5F 110X4 TIG (CATHETERS) IMPLANT
CATH INFINITI 5FR ANG PIGTAIL (CATHETERS) ×3 IMPLANT
CATH INFINITI 5FR JL4 (CATHETERS) ×3 IMPLANT
CATH INFINITI JR4 5F (CATHETERS) ×3 IMPLANT
DEVICE CLOSURE MYNXGRIP 5F (Vascular Products) ×3 IMPLANT
GLIDESHEATH SLEND A-KIT 6F 22G (SHEATH) ×3 IMPLANT
KIT MANI 3VAL PERCEP (MISCELLANEOUS) ×3 IMPLANT
NDL PERC 4X21 ACCESS (NEEDLE) ×3 IMPLANT
NEEDLE PERC 18GX7CM (NEEDLE) ×3 IMPLANT
PACK CARDIAC CATH (CUSTOM PROCEDURE TRAY) ×3 IMPLANT
SHEATH AVANTI 5FR X 11CM (SHEATH) ×3 IMPLANT
WIRE EMERALD 3MM-J .035X150CM (WIRE) ×3 IMPLANT
WIRE SAFE-T 1.5MM-J .035X260CM (WIRE) IMPLANT

## 2016-03-23 NOTE — Interval H&P Note (Signed)
History and Physical Interval Note:  03/23/2016 1:13 PM  Dawn HohDebra T Schlechter  has presented today for surgery, with the diagnosis of Abnormal Nuclear Stress Test.  The various methods of treatment have been discussed with the patient and family. After consideration of risks, benefits and other options for treatment, the patient has consented to  Procedure(s): Left Heart Cath and Coronary Angiography (Left) as a surgical intervention .  The patient's history has been reviewed, patient examined, no change in status, stable for surgery.  I have reviewed the patient's chart and labs.  Questions were answered to the patient's satisfaction.    Cath Lab Visit (complete for each Cath Lab visit)  Clinical Evaluation Leading to the Procedure:   ACS: No.  Non-ACS:    Anginal Classification: CCS III  Anti-ischemic medical therapy: Maximal Therapy (2 or more classes of medications)  Non-Invasive Test Results: Intermediate-risk stress test findings: cardiac mortality 1-3%/year  Prior CABG: No previous CABG   Ischemic Symptoms? CCS III (Marked limitation of ordinary activity) Anti-ischemic Medical Therapy? Minimal Therapy (1 class of medications) Non-invasive Test Results? Intermediate-risk stress test findings: cardiac mortality 1-3%/year Prior CABG? No Previous CABG   Patient Information:   1-2V CAD, no prox LAD  U (6)  Indication: 16; Score: 6   Patient Information:   CTO of 1 vessel, no other CAD  U (6)  Indication: 26; Score: 6   Patient Information:   1V CAD with prox LAD  A (7)  Indication: 32; Score: 7   Patient Information:   2V-CAD with prox LAD  A (8)  Indication: 38; Score: 8   Patient Information:   3V-CAD without LMCA  A (8)  Indication: 44; Score: 8   Patient Information:   3V-CAD without LMCA With Abnormal LV systolic function  A (9)  Indication: 48; Score: 9   Patient Information:   LMCA-CAD  A (9)  Indication: 49; Score: 9   Patient  Information:   2V-CAD with prox LAD PCI  A (7)  Indication: 62; Score: 7   Patient Information:   2V-CAD with prox LAD CABG  A (8)  Indication: 62; Score: 8   Patient Information:   3V-CAD without LMCA With Low CAD burden(i.e., 3 focal stenoses, low SYNTAX score) PCI  A (7)  Indication: 63; Score: 7   Patient Information:   3V-CAD without LMCA With Low CAD burden(i.e., 3 focal stenoses, low SYNTAX score) CABG  A (9)  Indication: 63; Score: 9   Patient Information:   3V-CAD without LMCA E06c - Intermediate-high CAD burden (i.e., multiple diffuse lesions, presence of CTO, or high SYNTAX score) PCI  U (4)  Indication: 64; Score: 4   Patient Information:   3V-CAD without LMCA E06c - Intermediate-high CAD burden (i.e., multiple diffuse lesions, presence of CTO, or high SYNTAX score) CABG  A (9)  Indication: 64; Score: 9   Patient Information:   LMCA-CAD With Isolated LMCA stenosis  PCI  U (6)  Indication: 65; Score: 6   Patient Information:   LMCA-CAD Additional CAD, low CAD burden (i.e., 1- to 2-vessel additional involvement, low SYNTAX score) PCI  U (5)  Indication: 66; Score: 5   Patient Information:   LMCA-CAD Additional CAD, low CAD burden (i.e., 1- to 2-vessel additional involvement, low SYNTAX score) CABG  A (9)  Indication: 66; Score: 9   Patient Information:   LMCA-CAD With Isolated LMCA stenosis  CABG  A (9)  Indication: 66; Score: 9   Patient Information:  LMCA-CAD Additional CAD, intermediate-high CAD burden (i.e., 3-vessel involvement, presence of CTO, or high SYNTAX score) PCI  I (3)  Indication: 67; Score: 3   Patient Information:   LMCA-CAD Additional CAD, intermediate-high CAD burden (i.e., 3-vessel involvement, presence of CTO, or high SYNTAX score) CABG  A (9)  Indication: 67; Score: 9  HARDING, DAVID W  Marykay Lex, M.D., M.S.  [facility]  [address] [place], [place] [address] [phone] Fax [phone]

## 2016-03-23 NOTE — H&P (View-Only) (Signed)
Cardiology Office Note   Date:  03/08/2016   ID:  Dawn Byrd, DOB [birth year], MRN [id number]  Referring Doctor:  No PCP Per Patient. Pt was referred to our office by ED doctor Dr. Jene Every.  Pt sees Dr. Cliffton Asters   Cardiologist:   Almond Lint, MD   Reason for consultation:  Chief Complaint  Patient presents with  . Chest Pain    follow up after stress test      History of Present Illness: Dawn Byrd is a 60 y.o. female who presents for Follow-up after stress test. It was ordered for chest pain, left arm pain and left back of the shoulder pain. Since her last visit, she has had no significant recurrence of her symptoms.  She also reported SOB with activity, together with pounding in chest when this happens (not racing heart beat, just strong heartbeat). She continues to have some shortness of breath.  ROS:  Please see the history of present illness.   Negative for orthopnea, PND, edema, claudication, bleeding, headache, nausea, vomiting, fever, abd pain. All other systems are reviewed and negative.     Past Medical History  Diagnosis Date  . Hypertension   . HIV (human immunodeficiency virus infection) (HCC)   . Nerve disorder   . Chest pain, unspecified   . Hyperlipidemia     Past Surgical History  Procedure Laterality Date  . Rupture disc neck  12/2006  . Abdominal hysterectomy    . Bladder suspension       reports that she has been smoking Cigarettes.  She has a 30 pack-year smoking history. She has never used smokeless tobacco. She reports that she drinks about 1.2 oz of alcohol per week. She reports that she does not use illicit drugs.   family history includes Atrial fibrillation in her father; CVA in her father; Other in her mother.   Current Outpatient Prescriptions  Medication Sig Dispense Refill  . aspirin EC 81 MG tablet Take 1 tablet (81 mg total) by mouth daily.    Marland Kitchen atenolol (TENORMIN) 50 MG tablet Take 1 tablet (50 mg total) by mouth  daily. 30 tablet 11  . atorvastatin (LIPITOR) 10 MG tablet Take 1 tablet (10 mg total) by mouth daily. 30 tablet 11  . citalopram (CELEXA) 40 MG tablet Take 1 tablet (40 mg total) by mouth daily. 30 tablet 11  . elvitegravir-cobicistat-emtricitabine-tenofovir (GENVOYA) 150-150-200-10 MG TABS tablet Take 1 tablet by mouth daily with breakfast. 30 tablet 11  . folic acid (FOLVITE) 400 MCG tablet Take 400 mcg by mouth daily.      . nitroGLYCERIN (NITROSTAT) 0.4 MG SL tablet Place 1 tablet (0.4 mg total) under the tongue every 5 (five) minutes as needed for chest pain. 25 tablet 3  . potassium chloride (KLOR-CON) 20 MEQ packet Take 20 mEq by mouth 2 (two) times daily.    . varenicline (CHANTIX PAK) 0.5 MG X 11 & 1 MG X 42 tablet Take one 0.5 mg tablet by mouth once daily for 3 days, then increase to one 0.5 mg tablet twice daily for 4 days, then increase to one 1 mg tablet twice daily. (Patient not taking: Reported on 03/08/2016) 53 tablet 1   Current Facility-Administered Medications  Medication Dose Route Frequency Provider Last Rate Last Dose  . pneumococcal 23 valent vaccine (PNU-IMMUNE) injection 0.5 mL  0.5 mL Intramuscular Once Carolin Guernsey, NP        Review of patient's allergies indicates no  known allergies.    PHYSICAL EXAM: VS:  BP 122/88 mmHg  Pulse 58  Ht  (1.676 m)  Wt 157 lb 12.8 oz (71.578 kg)  BMI 25.48 kg/m2 , BMI Body mass index is 25.48 kg/(m^2). GENERAL:  well developed, well nourished, overweight, not in acute distress HEENT: normocephalic, pink conjunctivae, anicteric sclerae, no xanthelasma, normal dentition, oropharynx clear NECK:  no neck vein engorgement, JVP normal, no hepatojugular reflux, carotid upstroke brisk and symmetric, no bruit, no thyromegaly, no lymphadenopathy LUNGS:  good respiratory effort, clear to auscultation bilaterally CV:  PMI not displaced, no thrills, no lifts, S1 and S2 within normal limits, no palpable S3 or S4, no murmurs, no  rubs, no gallops ABD:  Soft, nontender, nondistended, normoactive bowel sounds, no abdominal aortic bruit, no hepatomegaly, no splenomegaly MS: nontender back, no kyphosis, no scoliosis, no joint deformities EXT:  2+ DP/PT pulses, no edema, no varicosities, no cyanosis, no clubbing SKIN: warm, nondiaphoretic, normal turgor, no ulcers NEUROPSYCH: alert, oriented to person, place, and time, sensory/motor grossly intact, normal mood, appropriate affect     Recent Labs: 02/08/2016: ALT 20; BUN 12; Creatinine, Ser 0.83; Hemoglobin 15.3; Platelets 164; Potassium 4.0; Sodium 139    Lipid Panel    Component Value Date/Time   CHOL 134 05/14/2015 1129   TRIG 76 05/14/2015 1129   HDL 43* 05/14/2015 1129   CHOLHDL 3.1 05/14/2015 1129   VLDL 15 05/14/2015 1129   LDLCALC 76 05/14/2015 1129      Wt Readings from Last 3 Encounters:  03/08/16 157 lb 12.8 oz (71.578 kg)  02/16/16 156 lb 12 oz (71.101 kg)  02/02/16 152 lb 8 oz (69.174 kg)      Other studies Reviewed:  EKG:  The ekg From 02/16/2016 was personally reviewed by me and it revealed sinus bradycardia, 57 bpm. No ischemic changes, otherwise normal.   Additional studies/ records that were reviewed personally reviewed by me today include:  Stress test: ex treadmill test 04/11/2012, Dr. Mariah Milling ordered. Normal test. 8 mins. 105% MPHR. 10.1 METs.   Exercise nuclear stress test 02/17/2016:  Defect 1: There is a small defect of mild severity present in the basal anterolateral, mid anterior and mid anterolateral location - the defect is reversible & therefore consider anterior ischemia.  Cannot exclude shifting breast attenuation.  The left ventricular ejection fraction is hyperdynamic (>65%).  Very poor exercise tolerance.  There was no ST segment deviation noted during stress. No T wave inversion was noted during stress. The patient did very poorly on the TM - consistent with very poor exercise tolerance.  Images suggest a  potential (very faint) mid-apical Anterior-anterolateral diffuse perfusion effect that has the appearance of breast attenuation (but not seen in rest images). Cannot exclude ischemia. Defer to clinical presentation.     ASSESSMENT AND PLAN:  Chest pain SOB with exertion Pt has significant risk factors that warranted further work up - age, htn, hyperlipidemia, smoking.  Discussed in detail findings of exercise nuclear stress test from 02/17/2016. Although the abnormality is small, she continues to have shortness of breath with exertion. Discussed recommendation of proceeding with cardiac catheterization. Risks and benefits of cardiac catheterization have been discussed with the patient.  These include less than 1% chance of major complications including but not limited to bleeding, infection, kidney damage, arrhyhthmia, heart attack, stroke, and death.  The patient understands these risks and is willing to proceed. Patient prescribed ASA, and NTG SL prn for chest pain. Patient instructed to call [phone]  for unrelenting chest pain.    SOB with exertion  As discussed above. . After cardiac catheterization, Rec ffup with PCP for pulmonary eval with PFTs, imaging. Pt verbalized understanding.   HTN - BP is well controlled. Continue monitoring BP. Continue current medical therapy and lifestyle changes. Rec to obtain BP monitor.  Current medicines are reviewed at length with the patient today.  The patient does not have concerns regarding medicines.  The following changes have been made:  no change  Labs/ tests ordered today include: Orders Placed This Encounter  Procedures  . CBC with Differential/Platelet  . Basic Metabolic Panel (BMET)  . INR/PT    I had a lengthy and detailed discussion with the patient regarding diagnoses, prognosis, diagnostic options, treatment options, and side effects of medications.   I counseled the patient on importance of lifestyle modification including  heart healthy diet, regular physical activity, and smoking cessation.   Disposition:   FU with undersigned after procedure Signed, Almond LintAileen Yarden Manuelito, MD  03/08/2016 12:04 PM    [facility]

## 2016-03-23 NOTE — Discharge Instructions (Signed)
Angiogram, Care After °Refer to this sheet in the next few weeks. These instructions provide you with information about caring for yourself after your procedure. Your health care provider may also give you more specific instructions. Your treatment has been planned according to current medical practices, but problems sometimes occur. Call your health care provider if you have any problems or questions after your procedure. °WHAT TO EXPECT AFTER THE PROCEDURE °After your procedure, it is typical to have the following: °· Bruising at the catheter insertion site that usually fades within 1-2 weeks. °· Blood collecting in the tissue (hematoma) that may be painful to the touch. It should usually decrease in size and tenderness within 1-2 weeks. °HOME CARE INSTRUCTIONS °· Take medicines only as directed by your health care provider. °· You may shower 24-48 hours after the procedure or as directed by your health care provider. Remove the bandage (dressing) and gently wash the site with plain soap and water. Pat the area dry with a clean towel. Do not rub the site, because this may cause bleeding. °· Do not take baths, swim, or use a hot tub until your health care provider approves. °· Check your insertion site every day for redness, swelling, or drainage. °· Do not apply powder or lotion to the site. °· Do not lift over 10 lb (4.5 kg) for 5 days after your procedure or as directed by your health care provider. °· Ask your health care provider when it is okay to: °¨ Return to work or school. °¨ Resume usual physical activities or sports. °¨ Resume sexual activity. °· Do not drive home if you are discharged the same day as the procedure. Have someone else drive you. °· You may drive 24 hours after the procedure unless otherwise instructed by your health care provider. °· Do not operate machinery or power tools for 24 hours after the procedure or as directed by your health care provider. °· If your procedure was done as an  outpatient procedure, which means that you went home the same day as your procedure, a responsible adult should be with you for the first 24 hours after you arrive home. °· Keep all follow-up visits as directed by your health care provider. This is important. °SEEK MEDICAL CARE IF: °· You have a fever. °· You have chills. °· You have increased bleeding from the catheter insertion site. Hold pressure on the site. °SEEK IMMEDIATE MEDICAL CARE IF: °· You have unusual pain at the catheter insertion site. °· You have redness, warmth, or swelling at the catheter insertion site. °· You have drainage (other than a small amount of blood on the dressing) from the catheter insertion site. °· The catheter insertion site is bleeding, and the bleeding does not stop after 30 minutes of holding steady pressure on the site. °· The area near or just beyond the catheter insertion site becomes pale, cool, tingly, or numb. °  °This information is not intended to replace advice given to you by your health care provider. Make sure you discuss any questions you have with your health care provider. °  °Document Released: 06/30/2005 Document Revised: 01/02/2015 Document Reviewed: 05/15/2013 °Elsevier Interactive Patient Education ©2016 [employer] ° °

## 2016-03-27 ENCOUNTER — Encounter: Payer: Self-pay | Admitting: Cardiology

## 2016-03-30 ENCOUNTER — Ambulatory Visit (INDEPENDENT_AMBULATORY_CARE_PROVIDER_SITE_OTHER): Payer: Medicare Other | Admitting: Cardiology

## 2016-03-30 ENCOUNTER — Encounter: Payer: Self-pay | Admitting: Cardiology

## 2016-03-30 VITALS — BP 108/68 | HR 65 | Ht 66.0 in | Wt 154.8 lb

## 2016-03-30 DIAGNOSIS — I2510 Atherosclerotic heart disease of native coronary artery without angina pectoris: Secondary | ICD-10-CM | POA: Diagnosis not present

## 2016-03-30 DIAGNOSIS — I10 Essential (primary) hypertension: Secondary | ICD-10-CM

## 2016-03-30 MED ORDER — ISOSORBIDE MONONITRATE ER 30 MG PO TB24: 30.0000 mg | ORAL_TABLET | Freq: Every day | ORAL | Status: AC

## 2016-03-30 NOTE — Patient Instructions (Signed)
Medication Instructions:  Your physician has recommended you make the following change in your medication:  1. Start Imdur 30 mg and take 1/2 tablet once daily.   Labwork: None   Testing/Procedures: None ordered  Follow-Up: Your physician recommends that you schedule a follow-up appointment in: 2 months with Dr. Alvino ChapelIngal  Date & Time: ___________________________________________________________   Any Other Special Instructions Will Be Listed Below (If Applicable).     If you need a refill on your cardiac medications before your next appointment, please call your pharmacy.

## 2016-03-30 NOTE — Progress Notes (Signed)
Cardiology Office Note   Date:  03/30/2016   ID:  Dawn Byrd, DOB [birth year], MRN [id number]  Referring Doctor:  No PCP Per Patient. Pt was referred to our office by ED doctor Dr. Jene Every.  Pt sees Dr. Cliffton Asters   Cardiologist:   Almond Lint, MD   Reason for consultation:  Chief Complaint  Patient presents with  . other    Follow up s/p cardiac cath.       History of Present Illness: Dawn Byrd is a 60 y.o. female who presents for Follow-up after heart cath.   patient has had no recurrence of chest pain. She complains of a noticeable knot on the right groin after left heart cath. In terms of hypertension, blood pressure within the normal range.  Patient has no complaints of headache, fever, cough, colds, abdominal pain, bleeding issues.  ROS:  Please see the history of present illness.   Negative for orthopnea, PND, edema, claudication, bleeding, headache, nausea, vomiting, fever, abd pain. All other systems are reviewed and negative.     Past Medical History  Diagnosis Date  . Hypertension   . HIV (human immunodeficiency virus infection) (HCC)   . Nerve disorder   . Chest pain, unspecified   . Hyperlipidemia     Past Surgical History  Procedure Laterality Date  . Rupture disc neck  12/2006  . Abdominal hysterectomy    . Bladder suspension    . Back surgery    . Cardiac catheterization Left 03/23/2016    Procedure: Left Heart Cath and Coronary Angiography;  Surgeon: Marykay Lex, MD;  Location: [facility] INVASIVE CV LAB;  Service: Cardiovascular;  Laterality: Left;     reports that she has been smoking Cigarettes.  She has a 30 pack-year smoking history. She has never used smokeless tobacco. She reports that she drinks about 1.2 oz of alcohol per week. She reports that she does not use illicit drugs.   family history includes Atrial fibrillation in her father; CVA in her father; Other in her mother.   Current Outpatient Prescriptions  Medication Sig  Dispense Refill  . aspirin EC 81 MG tablet Take 1 tablet (81 mg total) by mouth daily.    Marland Kitchen atenolol (TENORMIN) 50 MG tablet Take 1 tablet (50 mg total) by mouth daily. 30 tablet 11  . atorvastatin (LIPITOR) 10 MG tablet Take 1 tablet (10 mg total) by mouth daily. 30 tablet 11  . citalopram (CELEXA) 40 MG tablet Take 1 tablet (40 mg total) by mouth daily. 30 tablet 11  . elvitegravir-cobicistat-emtricitabine-tenofovir (GENVOYA) 150-150-200-10 MG TABS tablet Take 1 tablet by mouth daily with breakfast. 30 tablet 11  . folic acid (FOLVITE) 400 MCG tablet Take 400 mcg by mouth daily.      . nitroGLYCERIN (NITROSTAT) 0.4 MG SL tablet Place 1 tablet (0.4 mg total) under the tongue every 5 (five) minutes as needed for chest pain. 25 tablet 3  . potassium chloride (KLOR-CON) 20 MEQ packet Take 20 mEq by mouth 2 (two) times daily.    . varenicline (CHANTIX PAK) 0.5 MG X 11 & 1 MG X 42 tablet Take one 0.5 mg tablet by mouth once daily for 3 days, then increase to one 0.5 mg tablet twice daily for 4 days, then increase to one 1 mg tablet twice daily. 53 tablet 1  . isosorbide mononitrate (IMDUR) 30 MG 24 hr tablet Take 1 tablet (30 mg total) by mouth daily. Take 1/2 tablet once daily  30 tablet 6   Current Facility-Administered Medications  Medication Dose Route Frequency Provider Last Rate Last Dose  . pneumococcal 23 valent vaccine (PNU-IMMUNE) injection 0.5 mL  0.5 mL Intramuscular Once Carolin Guernsey, NP        Review of patient's allergies indicates no known allergies.    PHYSICAL EXAM: VS:  BP 108/68 mmHg  Pulse 65  Ht  (1.676 m)  Wt 154 lb 12 oz (70.194 kg)  BMI 24.99 kg/m2  SpO2 98% , BMI Body mass index is 24.99 kg/(m^2). GENERAL:  well developed, well nourished, overweight, not in acute distress HEENT: normocephalic, pink conjunctivae, anicteric sclerae, no xanthelasma, normal dentition, oropharynx clear NECK:  no neck vein engorgement, JVP normal, no hepatojugular reflux,  carotid upstroke brisk and symmetric, no bruit, no thyromegaly, no lymphadenopathy LUNGS:  good respiratory effort, clear to auscultation bilaterally CV:  PMI not displaced, no thrills, no lifts, S1 and S2 within normal limits, no palpable S3 or S4, no murmurs, no rubs, no gallops Bilateral radial impulses within normal limits Right femoral pulse within normal limits, no bruit ABD:  Soft, nontender, nondistended, normoactive bowel sounds, no abdominal aortic bruit, no hepatomegaly, no splenomegaly MS: nontender back, no kyphosis, no scoliosis, no joint deformities EXT:  2+ DP/PT pulses, no edema, no varicosities, no cyanosis, no clubbing SKIN: warm, nondiaphoretic, normal turgor, no ulcers NEUROPSYCH: alert, oriented to person, place, and time, sensory/motor grossly intact, normal mood, appropriate affect     Recent Labs: 02/08/2016: ALT 20 03/22/2016: BUN 13; Creatinine, Ser 0.84; Hemoglobin 14.7; Platelets 157; Potassium 4.2; Sodium 137    Lipid Panel    Component Value Date/Time   CHOL 134 05/14/2015 1129   TRIG 76 05/14/2015 1129   HDL 43* 05/14/2015 1129   CHOLHDL 3.1 05/14/2015 1129   VLDL 15 05/14/2015 1129   LDLCALC 76 05/14/2015 1129      Wt Readings from Last 3 Encounters:  03/30/16 154 lb 12 oz (70.194 kg)  03/23/16 157 lb (71.215 kg)  03/08/16 157 lb 12.8 oz (71.578 kg)      Other studies Reviewed:  EKG:  The ekg From 02/16/2016 was personally reviewed by me and it revealed sinus bradycardia, 57 bpm. No ischemic changes, otherwise normal.   Additional studies/ records that were reviewed personally reviewed by me today include:  Stress test: ex treadmill test 04/11/2012, Dr. Mariah Milling ordered. Normal test. 8 mins. 105% MPHR. 10.1 METs.   Exercise nuclear stress test 02/17/2016: 1. Defect 1: There is a small defect of mild severity present in the basal anterolateral, mid anterior and mid anterolateral location - the defect is reversible & therefore consider anterior  ischemia. 2. Cannot exclude shifting breast attenuation. 3. The left ventricular ejection fraction is hyperdynamic (>65%). 4. Very poor exercise tolerance. 5. There was no ST segment deviation noted during stress. No T wave inversion was noted during stress. The patient did very poorly on the TM - consistent with very poor exercise tolerance.  Images suggest a potential (very faint) mid-apical Anterior-anterolateral diffuse perfusion effect that has the appearance of breast attenuation (but not seen in rest images). Cannot exclude ischemia. Defer to clinical presentation.  Echocardiogram 03/03/2016: Left ventricle: The cavity size was normal. Systolic function was  normal. The estimated ejection fraction was in the range of 60%  to 65%. Wall motion was normal; there were no regional wall  motion abnormalities. Left ventricular diastolic function  parameters were normal. - Left atrium: The atrium was normal in size. -  Right ventricle: Systolic function was normal. - Pulmonary arteries: Systolic pressure was within the normal  range.  Impressions:  - Normal study.  Left heart catheterization 03/23/2016:    Angiographic minimal coronary disease. Diffuse Left Coronary Artery system spasm resolved with nitroglycerin. There is hyperdynamic left ventricular systolic function. Moderately elevated LVEDP following IV hydration for hypotension  Angiographically minimal coronary disease with no culprit lesion to explain the abnormal stress test.     ASSESSMENT AND PLAN:  Chest pain SOB with exertion CAD Left heart cath on 03/23/2016 showed angiographic minimal CAD. Left coronary artery vasospasm resolved with nitroglycerin.proximal to mid RCA, 25% stenosis, diffuse eccentric lesion. LVEDP with hydration 24 mmHg. Discussed results in detail with patient. Continue aspirin, continue beta blocker, continue statin therapy. Nitroglycerin sublingual when necessary. Sent in for  isosorbide mononitrate ER 30 mg tablet, half a tablet daily. Monitor for side effects. Follow-up in one month.  LDL goal less than 70. Consider repeat labs on next visit.  HTN - BP is well controlled. Continue monitoring BP. Continue current medical therapy and lifestyle changes. Rec to obtain BP monitor.  Current medicines are reviewed at length with the patient today.  The patient does not have concerns regarding medicines.    Labs/ tests ordered today include: No orders of the defined types were placed in this encounter.    I had a lengthy and detailed discussion with the patient regarding diagnoses, prognosis, diagnostic options, treatment options, and side effects of medications.   I counseled the patient on importance of lifestyle modification including heart healthy diet, regular physical activity, and smoking cessation.   Disposition:   FU with undersigned  In one month  Signed, Almond LintAileen Enos Muhl, MD  03/30/2016 11:45 AM    [facility]

## 2016-05-31 ENCOUNTER — Ambulatory Visit: Payer: Medicare Other | Admitting: Cardiology

## 2016-06-24 ENCOUNTER — Telehealth: Payer: Self-pay

## 2016-06-24 NOTE — Telephone Encounter (Signed)
Patient called saying she found a small tick on her shoulder and pulled it off. Patient stated the area is itching, red and slightly swollen. Patient was concerned about infection.  Explained to patient that she needs to go to an urgent care to get seen to make sure that she pulled all of the tick out. Patient verbalized understanding and said that she will go. Candace Murray,LPN

## 2016-06-25 ENCOUNTER — Other Ambulatory Visit: Payer: Self-pay | Admitting: Internal Medicine

## 2016-06-25 DIAGNOSIS — F329 Major depressive disorder, single episode, unspecified: Secondary | ICD-10-CM

## 2016-06-25 DIAGNOSIS — B20 Human immunodeficiency virus [HIV] disease: Secondary | ICD-10-CM

## 2016-06-25 DIAGNOSIS — I10 Essential (primary) hypertension: Secondary | ICD-10-CM

## 2016-06-25 DIAGNOSIS — E7800 Pure hypercholesterolemia, unspecified: Secondary | ICD-10-CM

## 2016-06-25 DIAGNOSIS — F32A Depression, unspecified: Secondary | ICD-10-CM

## 2016-07-28 ENCOUNTER — Other Ambulatory Visit: Payer: Self-pay | Admitting: *Deleted

## 2016-07-28 DIAGNOSIS — B20 Human immunodeficiency virus [HIV] disease: Secondary | ICD-10-CM

## 2016-07-28 DIAGNOSIS — F32A Depression, unspecified: Secondary | ICD-10-CM

## 2016-07-28 DIAGNOSIS — E7800 Pure hypercholesterolemia, unspecified: Secondary | ICD-10-CM

## 2016-07-28 DIAGNOSIS — F329 Major depressive disorder, single episode, unspecified: Secondary | ICD-10-CM

## 2016-07-28 DIAGNOSIS — I10 Essential (primary) hypertension: Secondary | ICD-10-CM

## 2016-07-28 MED ORDER — ELVITEG-COBIC-EMTRICIT-TENOFAF 150-150-200-10 MG PO TABS: 1.0000 | ORAL_TABLET | Freq: Every day | ORAL | 5 refills | Status: DC

## 2016-07-28 MED ORDER — ATENOLOL 50 MG PO TABS: ORAL_TABLET | ORAL | 5 refills | Status: DC

## 2016-07-28 MED ORDER — ATORVASTATIN CALCIUM 10 MG PO TABS: ORAL_TABLET | ORAL | 5 refills | Status: DC

## 2016-07-28 MED ORDER — CITALOPRAM HYDROBROMIDE 40 MG PO TABS: ORAL_TABLET | ORAL | 5 refills | Status: DC

## 2016-08-23 ENCOUNTER — Other Ambulatory Visit: Payer: Medicare Other

## 2016-08-23 DIAGNOSIS — B20 Human immunodeficiency virus [HIV] disease: Secondary | ICD-10-CM

## 2016-08-24 LAB — T-HELPER CELL (CD4) - (RCID CLINIC ONLY)
CD4 T CELL ABS: 920 /uL (ref 400–2700)
CD4 T CELL HELPER: 31 % — AB (ref 33–55)

## 2016-08-24 LAB — HIV-1 RNA QUANT-NO REFLEX-BLD: HIV-1 RNA Quant, Log: <1.3 Log copies/mL (ref <1.30)

## 2016-09-06 ENCOUNTER — Encounter: Payer: Self-pay | Admitting: Internal Medicine

## 2016-09-06 ENCOUNTER — Ambulatory Visit (INDEPENDENT_AMBULATORY_CARE_PROVIDER_SITE_OTHER): Payer: Medicare Other | Admitting: Internal Medicine

## 2016-09-06 DIAGNOSIS — F32A Depression, unspecified: Secondary | ICD-10-CM

## 2016-09-06 DIAGNOSIS — B20 Human immunodeficiency virus [HIV] disease: Secondary | ICD-10-CM

## 2016-09-06 DIAGNOSIS — F17200 Nicotine dependence, unspecified, uncomplicated: Secondary | ICD-10-CM | POA: Diagnosis not present

## 2016-09-06 DIAGNOSIS — Z23 Encounter for immunization: Secondary | ICD-10-CM | POA: Diagnosis not present

## 2016-09-06 DIAGNOSIS — F329 Major depressive disorder, single episode, unspecified: Secondary | ICD-10-CM | POA: Diagnosis not present

## 2016-09-06 NOTE — Assessment & Plan Note (Signed)
I talked to her again about the importance of cigarette cessation. Several years ago she used Chantix and was able to quit for about 6 months. She felt better when she was not smoking. I encouraged her to cut down and make another attempt to quit. She has a prescription for Chantix but has not filled it yet.

## 2016-09-06 NOTE — Assessment & Plan Note (Signed)
Her infection remains under excellent, long-term control.  She will continue Genvoya and follow-up after lab work in 6 months. 

## 2016-09-06 NOTE — Progress Notes (Signed)
Patient Active Problem List   Diagnosis Date Noted  . Human immunodeficiency virus (HIV) disease (HCC) 10/27/2006    Priority: High  . Dyslipidemia 03/29/2012    Priority: Medium  . Essential hypertension, benign 07/09/2010    Priority: Medium  . TOBACCO USER 03/19/2010    Priority: Medium  . Depression 03/19/2010    Priority: Medium  . DISORDERS, OBSESSIVE-COMPULSIVE 10/27/2006    Priority: Medium  . Abnormal nuclear stress test 03/23/2016  . Chest pain with moderate risk for cardiac etiology 03/23/2016  . Pain in the chest 02/16/2016  . SOB (shortness of breath) on exertion 02/16/2016  . Hyperlipidemia 02/16/2016  . Acute upper respiratory infection 02/02/2016  . Snoring 11/03/2014  . Chest pain 03/26/2012  . Chronic nausea 07/12/2011  . Insomnia 07/12/2011  . PNEUMOCOCCAL PNEUMONIA 02/12/2007  . Degenerative disc disease 10/27/2006    Patient's Medications  New Prescriptions   No medications on file  Previous Medications   ASPIRIN EC 81 MG TABLET    Take 1 tablet (81 mg total) by mouth daily.   ATENOLOL (TENORMIN) 50 MG TABLET    TAKE 1 TABLET(50 MG) BY MOUTH DAILY   ATORVASTATIN (LIPITOR) 10 MG TABLET    TAKE 1 TABLET(10 MG) BY MOUTH DAILY   CITALOPRAM (CELEXA) 40 MG TABLET    TAKE 1 TABLET(40 MG) BY MOUTH DAILY   ELVITEGRAVIR-COBICISTAT-EMTRICITABINE-TENOFOVIR (GENVOYA) 150-150-200-10 MG TABS TABLET    Take 1 tablet by mouth daily with breakfast.   FOLIC ACID (FOLVITE) 400 MCG TABLET    Take 400 mcg by mouth daily.     ISOSORBIDE MONONITRATE (IMDUR) 30 MG 24 HR TABLET    Take 1 tablet (30 mg total) by mouth daily. Take 1/2 tablet once daily   NITROGLYCERIN (NITROSTAT) 0.4 MG SL TABLET    Place 1 tablet (0.4 mg total) under the tongue every 5 (five) minutes as needed for chest pain.   POTASSIUM CHLORIDE (KLOR-CON) 20 MEQ PACKET    Take 20 mEq by mouth 2 (two) times daily.   VARENICLINE (CHANTIX PAK) 0.5 MG X 11 & 1 MG X 42 TABLET    Take one 0.5 mg tablet  by mouth once daily for 3 days, then increase to one 0.5 mg tablet twice daily for 4 days, then increase to one 1 mg tablet twice daily.  Modified Medications   No medications on file  Discontinued Medications   No medications on file    Subjective: Dawn Byrd is in for her routine HIV follow-up visit. She takes her Genvoya each evening with dinner and does not recall missing any doses. For the past 5 months she has had some dull aching pain in her left upper chest. It radiates into her left arm and the left side of her neck. The pain is there all of the time. It is unaffected by position, eating, activity and pressure. She went to the [facility] emergency department and was referred to a cardiologist who did an echocardiogram, EKG and chest x-ray all of which were negative for any cardiopulmonary disease. She continues to smoke one pack of cigarettes daily. She states that she is not feeling depressed.   Review of Systems: Review of Systems  Constitutional: Negative for chills, diaphoresis, fever, malaise/fatigue and weight loss.  HENT: Negative for sore throat.   Respiratory: Negative for cough, sputum production and shortness of breath.   Cardiovascular: Positive for chest pain.  Gastrointestinal: Negative for abdominal pain, diarrhea, nausea and  vomiting.  Genitourinary: Negative for dysuria and frequency.  Musculoskeletal: Negative for joint pain and myalgias.  Skin: Negative for rash.  Neurological: Positive for dizziness. Negative for headaches.  Psychiatric/Behavioral: Negative for depression and substance abuse. The patient is not nervous/anxious.     Past Medical History:  Diagnosis Date  . Chest pain, unspecified   . HIV (human immunodeficiency virus infection) (HCC)   . Hyperlipidemia   . Hypertension   . Nerve disorder     Social History  Substance Use Topics  . Smoking status: Current Every Day Smoker    Packs/day: 1.00    Years: 30.00    Types: Cigarettes  . Smokeless  tobacco: Never Used     Comment: Interested in Chantix, never filled the rx  . Alcohol use 1.2 oz/week    2 Standard drinks or equivalent per week    Family History  Problem Relation Age of Onset  . Other Mother   . CVA Father   . Atrial fibrillation Father     No Known Allergies  Objective:  Vitals:   09/06/16 0949  BP: (!) 158/90  Pulse: 70  Temp: 98.3 F (36.8 C)  TempSrc: Oral  SpO2: 98%  Weight: 150 lb (68 kg)   Body mass index is 24.21 kg/m.  Physical Exam  Constitutional: She is oriented to person, place, and time.  She is smiling and in good spirits.  HENT:  Mouth/Throat: No oropharyngeal exudate.  Eyes: Conjunctivae are normal.  Cardiovascular: Normal rate and regular rhythm.   No murmur heard. Pulmonary/Chest: Effort normal and breath sounds normal.  She has no tenderness to palpation of her upper chest neck or left arm.  Abdominal: Soft. There is no tenderness.  Musculoskeletal: Normal range of motion. She exhibits no edema or tenderness.  Neurological: She is alert and oriented to person, place, and time.  Skin: No rash noted.  Psychiatric: Mood and affect normal.    Lab Results Lab Results  Component Value Date   WBC 3.8 03/22/2016   HGB 14.7 03/22/2016   HCT 42.8 03/22/2016   MCV 95.1 03/22/2016   PLT 157 03/22/2016    Lab Results  Component Value Date   CREATININE 0.84 03/22/2016   BUN 13 03/22/2016   NA 137 03/22/2016   K 4.2 03/22/2016   CL 105 03/22/2016   CO2 25 03/22/2016    Lab Results  Component Value Date   ALT 20 02/08/2016   AST 28 02/08/2016   ALKPHOS 69 02/08/2016   BILITOT 0.7 02/08/2016    Lab Results  Component Value Date   CHOL 134 05/14/2015   HDL 43 (L) 05/14/2015   LDLCALC 76 05/14/2015   TRIG 76 05/14/2015   CHOLHDL 3.1 05/14/2015   HIV 1 RNA Quant (copies/mL)  Date Value  08/23/2016 <20  01/08/2016 <20  05/14/2015 29 (H)   CD4 T Cell Abs (/uL)  Date Value  08/23/2016 920  01/08/2016 630    05/14/2015 920     Problem List Items Addressed This Visit      High   Human immunodeficiency virus (HIV) disease (HCC)    Her infection remains under excellent, long-term control. She will continue Genvoya and follow-up after lab work in 6 months.      Relevant Orders   T-helper cell (CD4)- ([facility] clinic only)   HIV 1 RNA quant-no reflex-bld   CBC   Comprehensive metabolic panel   Lipid panel   RPR     Medium  Depression    She is no longer feeling depressed.      TOBACCO USER    I talked to her again about the importance of cigarette cessation. Several years ago she used Chantix and was able to quit for about 6 months. She felt better when she was not smoking. I encouraged her to cut down and make another attempt to quit. She has a prescription for Chantix but has not filled it yet.       Other Visit Diagnoses   None.       Cliffton Asters, MD [facility] [phone] pager   [phone] cell 09/06/2016, 10:10 AM

## 2016-09-06 NOTE — Assessment & Plan Note (Signed)
She is no longer feeling depressed. 

## 2016-11-18 IMAGING — CR DG CHEST 2V
2 series · 2 of 2 positions shown · non-contrast
Comparison: 03/06/2012

CLINICAL DATA: Chest pain began over 1 month ago. Shortness of
breath has gotten worse over time.

EXAM:
CHEST  2 VIEW

[chest pa]
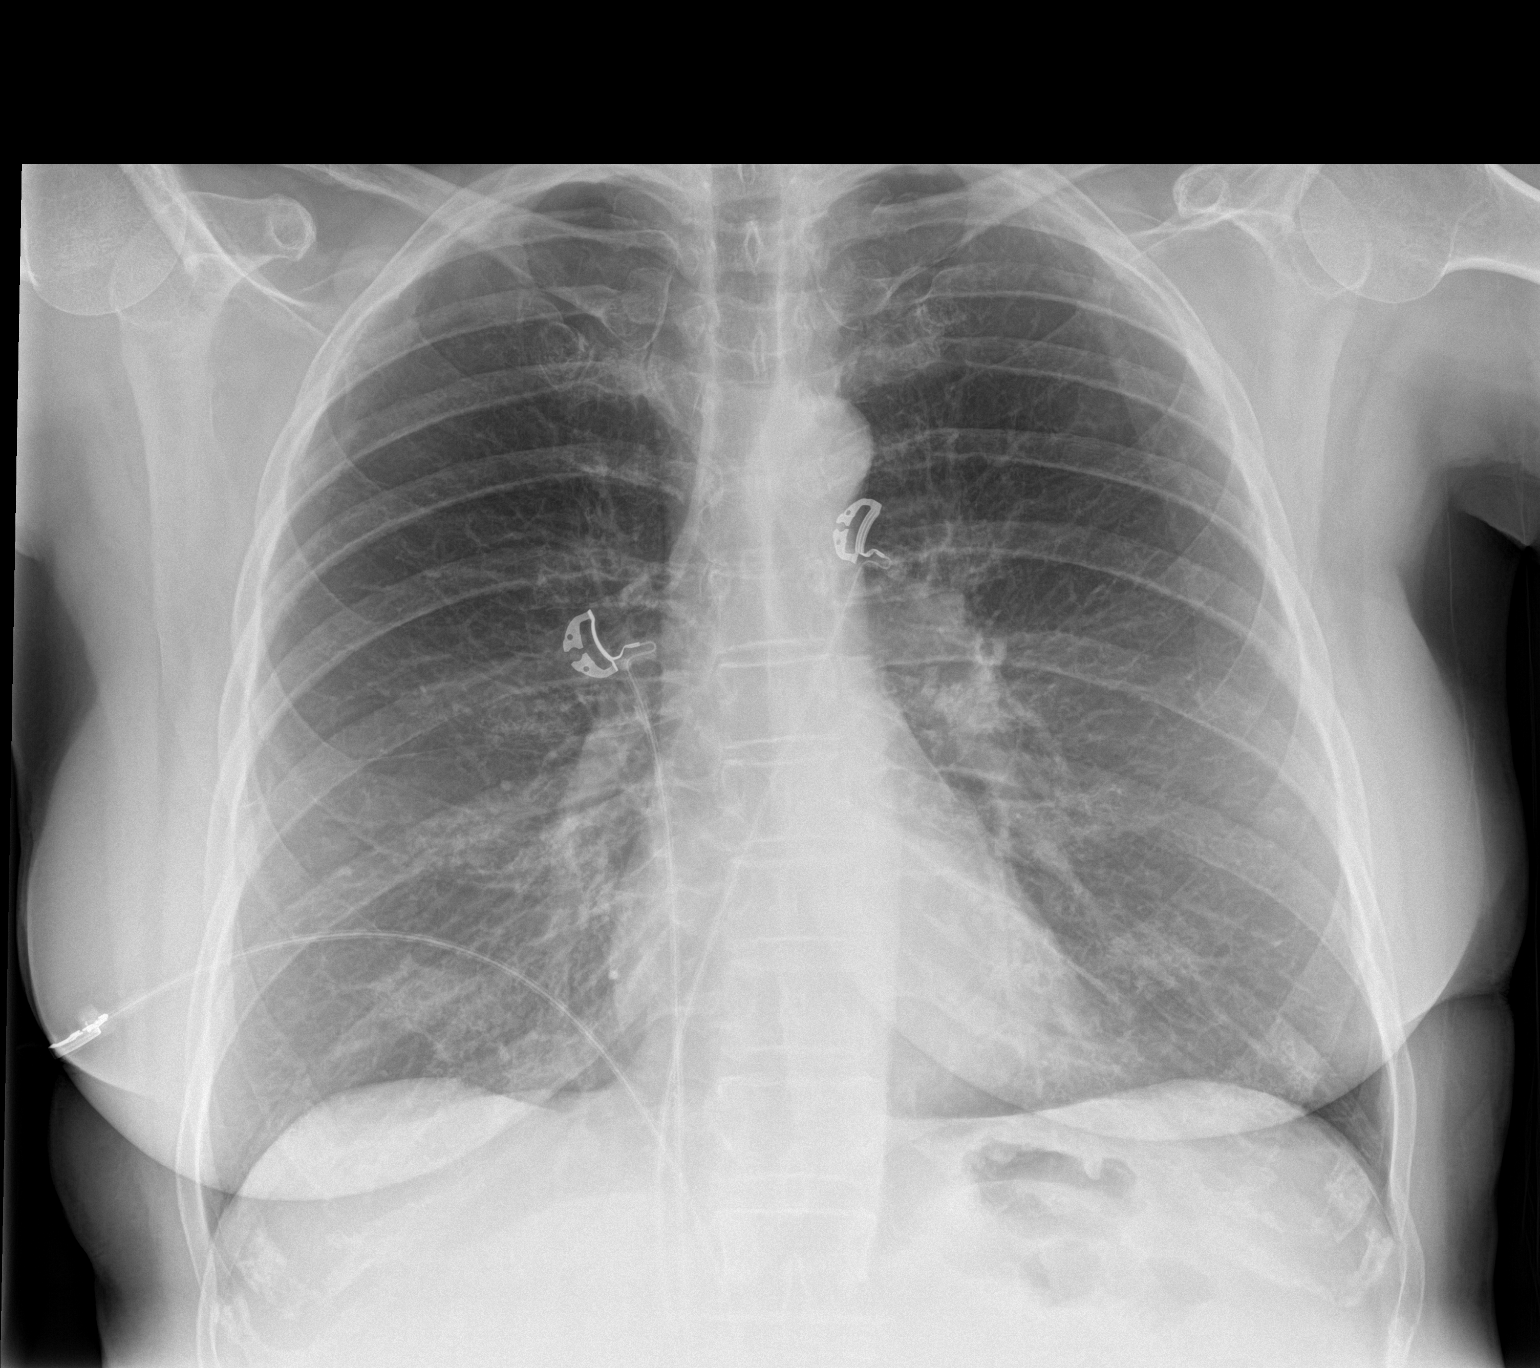

[chest lat]
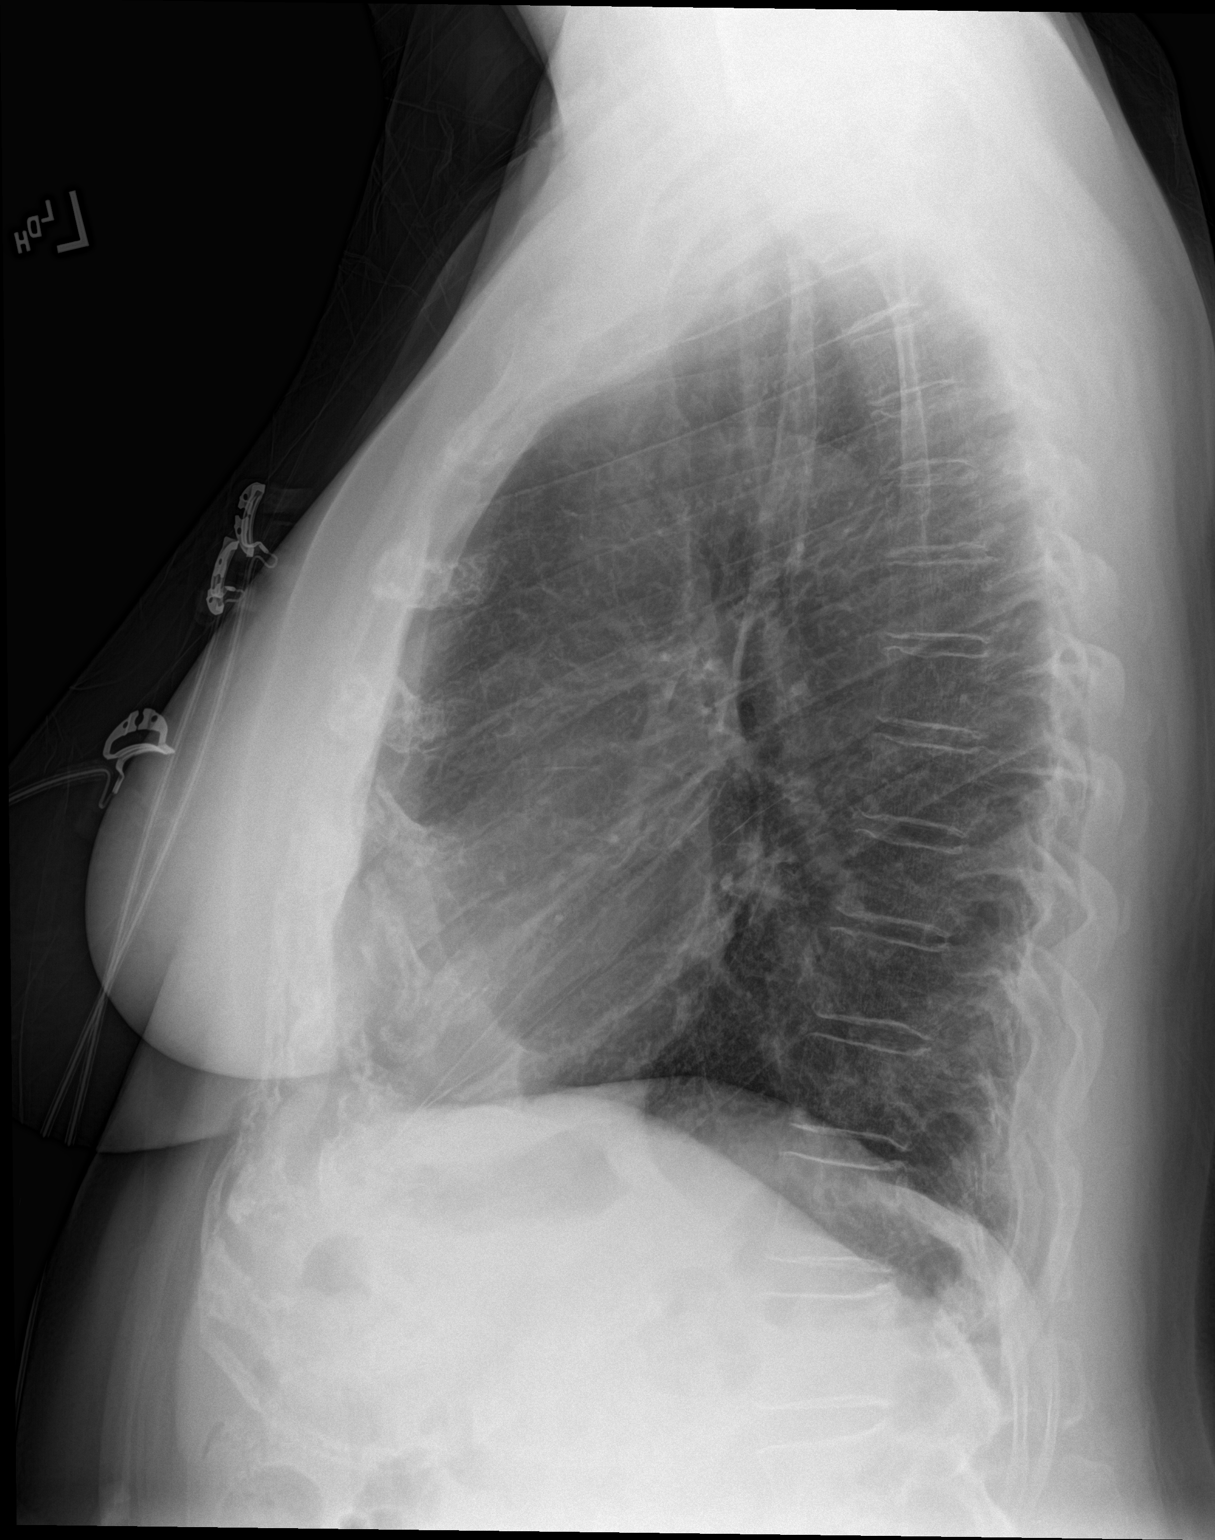

[2 of 2 positions shown; findings below may reference images not displayed]

FINDINGS: The heart size and mediastinal contours are within normal limits.
Both lungs are clear. The visualized skeletal structures are
unremarkable.
IMPRESSION: No active cardiopulmonary disease.

## 2016-11-26 ENCOUNTER — Other Ambulatory Visit: Payer: Self-pay | Admitting: Internal Medicine

## 2016-11-26 DIAGNOSIS — B20 Human immunodeficiency virus [HIV] disease: Secondary | ICD-10-CM

## 2016-11-26 DIAGNOSIS — I10 Essential (primary) hypertension: Secondary | ICD-10-CM

## 2016-11-26 DIAGNOSIS — F32A Depression, unspecified: Secondary | ICD-10-CM

## 2016-11-26 DIAGNOSIS — E7800 Pure hypercholesterolemia, unspecified: Secondary | ICD-10-CM

## 2016-11-26 DIAGNOSIS — F329 Major depressive disorder, single episode, unspecified: Secondary | ICD-10-CM

## 2017-06-27 ENCOUNTER — Ambulatory Visit: Payer: Medicare Other

## 2017-07-05 ENCOUNTER — Other Ambulatory Visit: Payer: Medicare Other

## 2017-07-05 DIAGNOSIS — B20 Human immunodeficiency virus [HIV] disease: Secondary | ICD-10-CM

## 2017-07-05 LAB — CBC
HCT: 42.3 % (ref 35.0–45.0)
Hemoglobin: 14.3 g/dL (ref 11.7–15.5)
MCH: 32.5 pg (ref 27.0–33.0)
MCHC: 33.8 g/dL (ref 32.0–36.0)
MCV: 96.1 fL (ref 80.0–100.0)
MPV: 10.2 fL (ref 7.5–12.5)
PLATELETS: 192 10*3/uL (ref 140–400)
RBC: 4.4 MIL/uL (ref 3.80–5.10)
RDW: 14.2 % (ref 11.0–15.0)
WBC: 5.3 10*3/uL (ref 3.8–10.8)

## 2017-07-05 LAB — COMPREHENSIVE METABOLIC PANEL
ALT: 10 U/L (ref 6–29)
AST: 14 U/L (ref 10–35)
Albumin: 4.4 g/dL (ref 3.6–5.1)
Alkaline Phosphatase: 53 U/L (ref 33–130)
BUN: 12 mg/dL (ref 7–25)
CHLORIDE: 105 mmol/L (ref 98–110)
CO2: 25 mmol/L (ref 20–31)
Calcium: 9.4 mg/dL (ref 8.6–10.4)
Creat: 0.87 mg/dL (ref 0.50–1.05)
Glucose, Bld: 90 mg/dL (ref 65–99)
POTASSIUM: 4.8 mmol/L (ref 3.5–5.3)
Sodium: 140 mmol/L (ref 135–146)
TOTAL PROTEIN: 7 g/dL (ref 6.1–8.1)
Total Bilirubin: 0.5 mg/dL (ref 0.2–1.2)

## 2017-07-05 LAB — LIPID PANEL
CHOL/HDL RATIO: 3.4 ratio (ref <5.0)
CHOLESTEROL: 177 mg/dL (ref <200)
HDL: 52 mg/dL (ref >50)
LDL CALC: 93 mg/dL (ref <100)
Triglycerides: 158 mg/dL — ABNORMAL HIGH (ref <150)
VLDL: 32 mg/dL — AB (ref <30)

## 2017-07-06 LAB — RPR

## 2017-07-06 LAB — T-HELPER CELL (CD4) - (RCID CLINIC ONLY)
CD4 % Helper T Cell: 35 % (ref 33–55)
CD4 T CELL ABS: 890 /uL (ref 400–2700)

## 2017-07-07 ENCOUNTER — Encounter: Payer: Self-pay | Admitting: Internal Medicine

## 2017-07-07 LAB — HIV-1 RNA QUANT-NO REFLEX-BLD
HIV 1 RNA QUANT: 27 {copies}/mL — AB
HIV-1 RNA Quant, Log: 1.43 Log copies/mL — ABNORMAL HIGH

## 2017-07-19 ENCOUNTER — Ambulatory Visit: Payer: Medicare Other | Admitting: Internal Medicine

## 2017-07-26 ENCOUNTER — Ambulatory Visit: Payer: Medicare Other | Admitting: Internal Medicine

## 2017-08-02 ENCOUNTER — Ambulatory Visit: Payer: Medicare Other | Admitting: Internal Medicine

## 2017-08-03 ENCOUNTER — Encounter: Payer: Self-pay | Admitting: Internal Medicine

## 2017-08-03 ENCOUNTER — Ambulatory Visit (INDEPENDENT_AMBULATORY_CARE_PROVIDER_SITE_OTHER): Payer: Medicare Other | Admitting: Internal Medicine

## 2017-08-03 DIAGNOSIS — F17200 Nicotine dependence, unspecified, uncomplicated: Secondary | ICD-10-CM

## 2017-08-03 DIAGNOSIS — R079 Chest pain, unspecified: Secondary | ICD-10-CM

## 2017-08-03 DIAGNOSIS — B20 Human immunodeficiency virus [HIV] disease: Secondary | ICD-10-CM | POA: Diagnosis not present

## 2017-08-03 DIAGNOSIS — F3342 Major depressive disorder, recurrent, in full remission: Secondary | ICD-10-CM

## 2017-08-03 MED ORDER — PANTOPRAZOLE SODIUM 20 MG PO TBEC: 20.0000 mg | DELAYED_RELEASE_TABLET | Freq: Every day | ORAL | 11 refills | Status: DC

## 2017-08-03 NOTE — Assessment & Plan Note (Addendum)
I suspect that her chest pain is related to acid reflux. I will give her a trial of proton pump inhibitor and see her back in 6 weeks.

## 2017-08-03 NOTE — Assessment & Plan Note (Signed)
Her viral load is just barely detectable but overall her adherence is perfect and as a result her infection has been under very good long-term control. She will continue Genvoya for now.

## 2017-08-03 NOTE — Assessment & Plan Note (Signed)
Her depression is in remission. 

## 2017-08-03 NOTE — Progress Notes (Signed)
Patient Active Problem List   Diagnosis Date Noted  . Human immunodeficiency virus (HIV) disease (HCC) 10/27/2006    Priority: High  . Dyslipidemia 03/29/2012    Priority: Medium  . Essential hypertension, benign 07/09/2010    Priority: Medium  . TOBACCO USER 03/19/2010    Priority: Medium  . Depression 03/19/2010    Priority: Medium  . DISORDERS, OBSESSIVE-COMPULSIVE 10/27/2006    Priority: Medium  . Abnormal nuclear stress test 03/23/2016  . Chest pain with moderate risk for cardiac etiology 03/23/2016  . Pain in the chest 02/16/2016  . SOB (shortness of breath) on exertion 02/16/2016  . Hyperlipidemia 02/16/2016  . Acute upper respiratory infection 02/02/2016  . Snoring 11/03/2014  . Chest pain 03/26/2012  . Chronic nausea 07/12/2011  . Insomnia 07/12/2011  . PNEUMOCOCCAL PNEUMONIA 02/12/2007  . Degenerative disc disease 10/27/2006    Patient's Medications  New Prescriptions   PANTOPRAZOLE (PROTONIX) 20 MG TABLET    Take 1 tablet (20 mg total) by mouth daily.  Previous Medications   ASPIRIN EC 81 MG TABLET    Take 1 tablet (81 mg total) by mouth daily.   ATENOLOL (TENORMIN) 50 MG TABLET    TAKE 1 TABLET(50 MG) BY MOUTH DAILY   ATENOLOL (TENORMIN) 50 MG TABLET    TAKE 1 TABLET(50 MG) BY MOUTH DAILY   ATORVASTATIN (LIPITOR) 10 MG TABLET    TAKE 1 TABLET(10 MG) BY MOUTH DAILY   ATORVASTATIN (LIPITOR) 10 MG TABLET    TAKE 1 TABLET(10 MG) BY MOUTH DAILY   CITALOPRAM (CELEXA) 40 MG TABLET    TAKE 1 TABLET(40 MG) BY MOUTH DAILY   CITALOPRAM (CELEXA) 40 MG TABLET    TAKE 1 TABLET(40 MG) BY MOUTH DAILY   FOLIC ACID (FOLVITE) 400 MCG TABLET    Take 400 mcg by mouth daily.     GENVOYA 150-150-200-10 MG TABS TABLET    TAKE 1 TABLET BY MOUTH DAILY WITH BREAKFAST   ISOSORBIDE MONONITRATE (IMDUR) 30 MG 24 HR TABLET    Take 1 tablet (30 mg total) by mouth daily. Take 1/2 tablet once daily   NITROGLYCERIN (NITROSTAT) 0.4 MG SL TABLET    Place 1 tablet (0.4 mg total) under  the tongue every 5 (five) minutes as needed for chest pain.   POTASSIUM CHLORIDE (KLOR-CON) 20 MEQ PACKET    Take 20 mEq by mouth 2 (two) times daily.   VARENICLINE (CHANTIX PAK) 0.5 MG X 11 & 1 MG X 42 TABLET    Take one 0.5 mg tablet by mouth once daily for 3 days, then increase to one 0.5 mg tablet twice daily for 4 days, then increase to one 1 mg tablet twice daily.  Modified Medications   No medications on file  Discontinued Medications   No medications on file    Subjective: Dawn Byrd is in for her routine HIV follow-up visit. She's had no problems obtaining, taking or tolerating her Genvoya. She takes it each evening with dinner and has not missed any doses. She has cut down just a little bit on her cigarettes but has no current plan to quit. She has been thinking about retrying Chantix which did help her quit for 6 months many years ago. She does not have a primary care provider currently. She has been feeling well with the exception of some mid chest discomfort that began about 3 months ago. He gets worse with hot, spicy food and cold liquids. It is  worse when she is laying on either side. She tried taking Tums for a short period of time but did not notice any relief. It is not affected by exertion. She does have some stable dyspnea on exertion.  Review of Systems: Review of Systems  Constitutional: Negative for chills, diaphoresis, fever, malaise/fatigue and weight loss.  HENT: Negative for sore throat.   Respiratory: Positive for shortness of breath. Negative for cough and sputum production.   Cardiovascular: Positive for chest pain.  Gastrointestinal: Positive for heartburn. Negative for abdominal pain, diarrhea, nausea and vomiting.  Genitourinary: Negative for dysuria and frequency.  Musculoskeletal: Negative for joint pain and myalgias.  Skin: Negative for rash.  Neurological: Negative for dizziness and headaches.  Psychiatric/Behavioral: Negative for depression and substance abuse.  The patient is not nervous/anxious.     Past Medical History:  Diagnosis Date  . Chest pain, unspecified   . HIV (human immunodeficiency virus infection) (HCC)   . Hyperlipidemia   . Hypertension   . Nerve disorder     Social History  Substance Use Topics  . Smoking status: Current Every Day Smoker    Packs/day: 1.00    Years: 30.00    Types: Cigarettes  . Smokeless tobacco: Never Used     Comment: Interested in Chantix, never filled the rx  . Alcohol use 1.2 oz/week    2 Standard drinks or equivalent per week    Family History  Problem Relation Age of Onset  . Other Mother   . CVA Father   . Atrial fibrillation Father     No Known Allergies  Objective:  Vitals:   08/03/17 1039  BP: (!) 151/84  Pulse: (!) 58  Temp: 97.8 F (36.6 C)  TempSrc: Oral  Weight: 147 lb (66.7 kg)   Body mass index is 23.73 kg/m.  Physical Exam  Constitutional: She is oriented to person, place, and time.  She is smiling and in good spirits.  HENT:  Mouth/Throat: No oropharyngeal exudate.  Eyes: Conjunctivae are normal.  Cardiovascular: Normal rate and regular rhythm.   No murmur heard. Pulmonary/Chest: Effort normal and breath sounds normal.  Abdominal: Soft. She exhibits no mass. There is no tenderness.  Musculoskeletal: Normal range of motion.  Neurological: She is alert and oriented to person, place, and time.  Skin: No rash noted.  Psychiatric: Mood and affect normal.    Lab Results Lab Results  Component Value Date   WBC 5.3 07/05/2017   HGB 14.3 07/05/2017   HCT 42.3 07/05/2017   MCV 96.1 07/05/2017   PLT 192 07/05/2017    Lab Results  Component Value Date   CREATININE 0.87 07/05/2017   BUN 12 07/05/2017   NA 140 07/05/2017   K 4.8 07/05/2017   CL 105 07/05/2017   CO2 25 07/05/2017    Lab Results  Component Value Date   ALT 10 07/05/2017   AST 14 07/05/2017   ALKPHOS 53 07/05/2017   BILITOT 0.5 07/05/2017    Lab Results  Component Value Date    CHOL 177 07/05/2017   HDL 52 07/05/2017   LDLCALC 93 07/05/2017   TRIG 158 (H) 07/05/2017   CHOLHDL 3.4 07/05/2017   Lab Results  Component Value Date   LABRPR NON REAC 07/05/2017   HIV 1 RNA Quant (copies/mL)  Date Value  07/05/2017 27 (H)  08/23/2016 <20  01/08/2016 <20   CD4 T Cell Abs (/uL)  Date Value  07/05/2017 890  08/23/2016 920  01/08/2016 630  Problem List Items Addressed This Visit      High   Human immunodeficiency virus (HIV) disease (HCC)    Her viral load is just barely detectable but overall her adherence is perfect and as a result her infection has been under very good long-term control. She will continue Genvoya for now.        Medium   Depression    Her depression is in remission.      TOBACCO USER    I encouraged her to go forward and develop a plan to quit cigarettes again. We will help her get a primary care provider. I will see her back in 6 weeks to talk with her about this again.        Unprioritized   Chest pain    I suspect that her chest pain is related to acid reflux. I will give her a trial of proton pump inhibitor and see her back in 6 weeks.      Relevant Medications   pantoprazole (PROTONIX) 20 MG tablet        Cliffton AstersJohn Afshin Chrystal, MD [facility] [phone] pager   [phone] cell 08/03/2017, 11:02 AM

## 2017-08-03 NOTE — Assessment & Plan Note (Signed)
I encouraged her to go forward and develop a plan to quit cigarettes again. We will help her get a primary care provider. I will see her back in 6 weeks to talk with her about this again.

## 2017-09-19 ENCOUNTER — Encounter: Payer: Self-pay | Admitting: Internal Medicine

## 2017-09-19 ENCOUNTER — Ambulatory Visit (INDEPENDENT_AMBULATORY_CARE_PROVIDER_SITE_OTHER): Payer: Medicare Other | Admitting: Internal Medicine

## 2017-09-19 DIAGNOSIS — F17200 Nicotine dependence, unspecified, uncomplicated: Secondary | ICD-10-CM | POA: Diagnosis not present

## 2017-09-19 DIAGNOSIS — K591 Functional diarrhea: Secondary | ICD-10-CM | POA: Diagnosis not present

## 2017-09-19 DIAGNOSIS — K219 Gastro-esophageal reflux disease without esophagitis: Secondary | ICD-10-CM | POA: Diagnosis not present

## 2017-09-19 DIAGNOSIS — Z23 Encounter for immunization: Secondary | ICD-10-CM | POA: Diagnosis not present

## 2017-09-19 DIAGNOSIS — B20 Human immunodeficiency virus [HIV] disease: Secondary | ICD-10-CM

## 2017-09-19 NOTE — Progress Notes (Signed)
Patient Active Problem List   Diagnosis Date Noted  . Human immunodeficiency virus (HIV) disease (HCC) 10/27/2006    Priority: High  . Dyslipidemia 03/29/2012    Priority: Medium  . Essential hypertension, benign 07/09/2010    Priority: Medium  . TOBACCO USER 03/19/2010    Priority: Medium  . Depression 03/19/2010    Priority: Medium  . DISORDERS, OBSESSIVE-COMPULSIVE 10/27/2006    Priority: Medium  . GERD (gastroesophageal reflux disease) 09/19/2017  . Abnormal nuclear stress test 03/23/2016  . Chest pain with moderate risk for cardiac etiology 03/23/2016  . Pain in the chest 02/16/2016  . SOB (shortness of breath) on exertion 02/16/2016  . Hyperlipidemia 02/16/2016  . Acute upper respiratory infection 02/02/2016  . Snoring 11/03/2014  . Chest pain 03/26/2012  . Chronic nausea 07/12/2011  . Insomnia 07/12/2011  . SYMPTOM, DIARRHEA NOS 08/10/2007  . PNEUMOCOCCAL PNEUMONIA 02/12/2007  . Degenerative disc disease 10/27/2006    Patient's Medications  New Prescriptions   No medications on file  Previous Medications   ASPIRIN EC 81 MG TABLET    Take 1 tablet (81 mg total) by mouth daily.   ATENOLOL (TENORMIN) 50 MG TABLET    TAKE 1 TABLET(50 MG) BY MOUTH DAILY   ATENOLOL (TENORMIN) 50 MG TABLET    TAKE 1 TABLET(50 MG) BY MOUTH DAILY   ATORVASTATIN (LIPITOR) 10 MG TABLET    TAKE 1 TABLET(10 MG) BY MOUTH DAILY   ATORVASTATIN (LIPITOR) 10 MG TABLET    TAKE 1 TABLET(10 MG) BY MOUTH DAILY   CITALOPRAM (CELEXA) 40 MG TABLET    TAKE 1 TABLET(40 MG) BY MOUTH DAILY   CITALOPRAM (CELEXA) 40 MG TABLET    TAKE 1 TABLET(40 MG) BY MOUTH DAILY   FOLIC ACID (FOLVITE) 400 MCG TABLET    Take 400 mcg by mouth daily.     GENVOYA 150-150-200-10 MG TABS TABLET    TAKE 1 TABLET BY MOUTH DAILY WITH BREAKFAST   ISOSORBIDE MONONITRATE (IMDUR) 30 MG 24 HR TABLET    Take 1 tablet (30 mg total) by mouth daily. Take 1/2 tablet once daily   NITROGLYCERIN (NITROSTAT) 0.4 MG SL TABLET    Place  1 tablet (0.4 mg total) under the tongue every 5 (five) minutes as needed for chest pain.   PANTOPRAZOLE (PROTONIX) 20 MG TABLET    Take 1 tablet (20 mg total) by mouth daily.   POTASSIUM CHLORIDE (KLOR-CON) 20 MEQ PACKET    Take 20 mEq by mouth 2 (two) times daily.   VARENICLINE (CHANTIX PAK) 0.5 MG X 11 & 1 MG X 42 TABLET    Take one 0.5 mg tablet by mouth once daily for 3 days, then increase to one 0.5 mg tablet twice daily for 4 days, then increase to one 1 mg tablet twice daily.  Modified Medications   No medications on file  Discontinued Medications   No medications on file    Subjective: Dawn Byrd is in for her routine HIV follow-up visit. She has had no problems obtaining, taking or tolerating her Genvoya and has not missed any doses. Her acid reflux symptoms resolved promptly after starting Protonix after her last visit. She has had some watery diarrhea for the past 4 months. It improves with Pepto-Bismol.She is still smoking 1 pack of cigarttes daily. She says that she and her boyfriend are wanting to quit.  Review of Systems: Review of Systems  Constitutional: Negative for chills, diaphoresis, fever, malaise/fatigue and  weight loss.  HENT: Negative for sore throat.   Respiratory: Negative for cough, sputum production and shortness of breath.   Cardiovascular: Negative for chest pain.  Gastrointestinal: Positive for diarrhea. Negative for abdominal pain, heartburn, nausea and vomiting.  Genitourinary: Negative for dysuria and frequency.  Musculoskeletal: Negative for joint pain and myalgias.  Skin: Negative for rash.  Neurological: Negative for dizziness and headaches.  Psychiatric/Behavioral: Negative for depression and substance abuse. The patient is not nervous/anxious.     Past Medical History:  Diagnosis Date  . Chest pain, unspecified   . HIV (human immunodeficiency virus infection) (HCC)   . Hyperlipidemia   . Hypertension   . Nerve disorder     Social History    Substance Use Topics  . Smoking status: Current Every Day Smoker    Packs/day: 1.00    Years: 30.00    Types: Cigarettes  . Smokeless tobacco: Never Used     Comment: Interested in Chantix, never filled the rx  . Alcohol use 1.2 oz/week    2 Standard drinks or equivalent per week     Comment: occ    Family History  Problem Relation Age of Onset  . Other Mother   . CVA Father   . Atrial fibrillation Father     No Known Allergies  Objective:  Vitals:   09/19/17 1018  BP: (!) 161/73  Pulse: 65  Temp: 97.8 F (36.6 C)  TempSrc: Oral  Weight: 149 lb (67.6 kg)   Body mass index is 24.05 kg/m.  Physical Exam  Constitutional: She is oriented to person, place, and time.  She is in good spirits.  HENT:  Mouth/Throat: No oropharyngeal exudate.  Eyes: Conjunctivae are normal.  Cardiovascular: Normal rate and regular rhythm.   No murmur heard. Pulmonary/Chest: Effort normal and breath sounds normal.  Abdominal: Soft. She exhibits no distension and no mass. There is no tenderness.  Musculoskeletal: Normal range of motion.  Neurological: She is alert and oriented to person, place, and time.  Skin: No rash noted.  Psychiatric: Mood and affect normal.    Lab Results Lab Results  Component Value Date   WBC 5.3 07/05/2017   HGB 14.3 07/05/2017   HCT 42.3 07/05/2017   MCV 96.1 07/05/2017   PLT 192 07/05/2017    Lab Results  Component Value Date   CREATININE 0.87 07/05/2017   BUN 12 07/05/2017   NA 140 07/05/2017   K 4.8 07/05/2017   CL 105 07/05/2017   CO2 25 07/05/2017    Lab Results  Component Value Date   ALT 10 07/05/2017   AST 14 07/05/2017   ALKPHOS 53 07/05/2017   BILITOT 0.5 07/05/2017    Lab Results  Component Value Date   CHOL 177 07/05/2017   HDL 52 07/05/2017   LDLCALC 93 07/05/2017   TRIG 158 (H) 07/05/2017   CHOLHDL 3.4 07/05/2017   Lab Results  Component Value Date   LABRPR NON REAC 07/05/2017   HIV 1 RNA Quant (copies/mL)  Date  Value  07/05/2017 27 (H)  08/23/2016 <20  01/08/2016 <20   CD4 T Cell Abs (/uL)  Date Value  07/05/2017 890  08/23/2016 920  01/08/2016 630     Problem List Items Addressed This Visit      High   Human immunodeficiency virus (HIV) disease (HCC)    Her infection is under very good long-term control. She will continue Genvoya and follow-up after lab work in 6 months.  Relevant Orders   T-helper cell (CD4)- ([facility] clinic only)   HIV 1 RNA quant-no reflex-bld   CBC   Comprehensive metabolic panel   Lipid panel   RPR     Medium   TOBACCO USER    She is in the contemplation stage of quitting. I talked to her about a cigarette cessation plan.        Unprioritized   GERD (gastroesophageal reflux disease)    She has had episodic acid reflux in the past I suggested that she stop the Protonix in about one month to see how she does.      SYMPTOM, DIARRHEA NOS    I suggest that she try over-the-counter Imodium.           Cliffton Asters, MD [facility] [phone] pager   [phone] cell 09/19/2017, 10:44 AM

## 2017-09-19 NOTE — Assessment & Plan Note (Signed)
She is in the contemplation stage of quitting. I talked to her about a cigarette cessation plan.

## 2017-09-19 NOTE — Assessment & Plan Note (Signed)
She has had episodic acid reflux in the past I suggested that she stop the Protonix in about one month to see how she does.

## 2017-09-19 NOTE — Assessment & Plan Note (Signed)
I suggest that she try over-the-counter Imodium.

## 2017-09-19 NOTE — Assessment & Plan Note (Signed)
Her infection is under very good long-term control. She will continue Genvoya and follow-up after lab work in 6 months.

## 2017-10-28 ENCOUNTER — Other Ambulatory Visit: Payer: Self-pay | Admitting: Internal Medicine

## 2017-10-28 DIAGNOSIS — E7800 Pure hypercholesterolemia, unspecified: Secondary | ICD-10-CM

## 2017-10-28 DIAGNOSIS — B20 Human immunodeficiency virus [HIV] disease: Secondary | ICD-10-CM

## 2017-10-28 DIAGNOSIS — F32A Depression, unspecified: Secondary | ICD-10-CM

## 2017-10-28 DIAGNOSIS — F329 Major depressive disorder, single episode, unspecified: Secondary | ICD-10-CM

## 2017-10-28 DIAGNOSIS — I10 Essential (primary) hypertension: Secondary | ICD-10-CM

## 2018-02-06 ENCOUNTER — Other Ambulatory Visit: Payer: Self-pay

## 2018-02-06 DIAGNOSIS — F17200 Nicotine dependence, unspecified, uncomplicated: Secondary | ICD-10-CM

## 2018-02-06 MED ORDER — VARENICLINE TARTRATE 0.5 MG X 11 & 1 MG X 42 PO MISC: ORAL | 3 refills | Status: DC

## 2018-02-06 NOTE — Telephone Encounter (Signed)
Patient is calling for second stage of dosing for Chantix.  Please advise.

## 2018-04-20 ENCOUNTER — Ambulatory Visit: Payer: Medicare Other

## 2018-04-20 ENCOUNTER — Encounter: Payer: Self-pay | Admitting: Internal Medicine

## 2018-07-31 ENCOUNTER — Other Ambulatory Visit: Payer: Self-pay | Admitting: Internal Medicine

## 2018-07-31 DIAGNOSIS — B20 Human immunodeficiency virus [HIV] disease: Secondary | ICD-10-CM

## 2018-08-04 ENCOUNTER — Other Ambulatory Visit: Payer: Self-pay | Admitting: Internal Medicine

## 2018-08-04 DIAGNOSIS — R079 Chest pain, unspecified: Secondary | ICD-10-CM

## 2018-08-08 ENCOUNTER — Other Ambulatory Visit: Payer: Self-pay | Admitting: Internal Medicine

## 2018-08-08 DIAGNOSIS — E7800 Pure hypercholesterolemia, unspecified: Secondary | ICD-10-CM

## 2018-08-08 DIAGNOSIS — F32A Depression, unspecified: Secondary | ICD-10-CM

## 2018-08-08 DIAGNOSIS — F329 Major depressive disorder, single episode, unspecified: Secondary | ICD-10-CM

## 2018-09-03 ENCOUNTER — Other Ambulatory Visit: Payer: Self-pay | Admitting: Internal Medicine

## 2018-09-03 DIAGNOSIS — B20 Human immunodeficiency virus [HIV] disease: Secondary | ICD-10-CM

## 2018-09-03 DIAGNOSIS — F17200 Nicotine dependence, unspecified, uncomplicated: Secondary | ICD-10-CM

## 2018-09-10 ENCOUNTER — Other Ambulatory Visit: Payer: Medicare Other

## 2018-09-10 ENCOUNTER — Other Ambulatory Visit: Payer: Self-pay | Admitting: Internal Medicine

## 2018-09-10 DIAGNOSIS — E7800 Pure hypercholesterolemia, unspecified: Secondary | ICD-10-CM

## 2018-09-10 DIAGNOSIS — Z113 Encounter for screening for infections with a predominantly sexual mode of transmission: Secondary | ICD-10-CM

## 2018-09-10 DIAGNOSIS — F329 Major depressive disorder, single episode, unspecified: Secondary | ICD-10-CM

## 2018-09-10 DIAGNOSIS — B20 Human immunodeficiency virus [HIV] disease: Secondary | ICD-10-CM

## 2018-09-10 DIAGNOSIS — Z79899 Other long term (current) drug therapy: Secondary | ICD-10-CM

## 2018-09-10 DIAGNOSIS — F32A Depression, unspecified: Secondary | ICD-10-CM

## 2018-09-10 DIAGNOSIS — R079 Chest pain, unspecified: Secondary | ICD-10-CM

## 2018-09-11 LAB — T-HELPER CELL (CD4) - (RCID CLINIC ONLY)
CD4 T CELL ABS: 780 /uL (ref 400–2700)
CD4 T CELL HELPER: 37 % (ref 33–55)

## 2018-09-12 LAB — COMPREHENSIVE METABOLIC PANEL
AG Ratio: 1.8 (calc) (ref 1.0–2.5)
ALBUMIN MSPROF: 4.5 g/dL (ref 3.6–5.1)
ALT: 10 U/L (ref 6–29)
AST: 16 U/L (ref 10–35)
Alkaline phosphatase (APISO): 57 U/L (ref 33–130)
BILIRUBIN TOTAL: 0.5 mg/dL (ref 0.2–1.2)
BUN: 17 mg/dL (ref 7–25)
CO2: 26 mmol/L (ref 20–32)
Calcium: 9.7 mg/dL (ref 8.6–10.4)
Chloride: 104 mmol/L (ref 98–110)
Creat: 0.77 mg/dL (ref 0.50–0.99)
GLUCOSE: 106 mg/dL — AB (ref 65–99)
Globulin: 2.5 g/dL (calc) (ref 1.9–3.7)
Potassium: 5 mmol/L (ref 3.5–5.3)
Sodium: 139 mmol/L (ref 135–146)
TOTAL PROTEIN: 7 g/dL (ref 6.1–8.1)

## 2018-09-12 LAB — LIPID PANEL
CHOL/HDL RATIO: 3.4 (calc) (ref <5.0)
CHOLESTEROL: 181 mg/dL (ref <200)
HDL: 53 mg/dL (ref >50)
LDL Cholesterol (Calc): 101 mg/dL (calc) — ABNORMAL HIGH
Non-HDL Cholesterol (Calc): 128 mg/dL (calc) (ref <130)
Triglycerides: 163 mg/dL — ABNORMAL HIGH (ref <150)

## 2018-09-12 LAB — HIV-1 RNA QUANT-NO REFLEX-BLD
HIV 1 RNA Quant: <20 copies/mL
HIV-1 RNA QUANT, LOG: NOT DETECTED {Log_copies}/mL

## 2018-09-12 LAB — CBC
HEMATOCRIT: 42.5 % (ref 35.0–45.0)
HEMOGLOBIN: 14.8 g/dL (ref 11.7–15.5)
MCH: 33.1 pg — ABNORMAL HIGH (ref 27.0–33.0)
MCHC: 34.8 g/dL (ref 32.0–36.0)
MCV: 95.1 fL (ref 80.0–100.0)
MPV: 11 fL (ref 7.5–12.5)
Platelets: 202 10*3/uL (ref 140–400)
RBC: 4.47 10*6/uL (ref 3.80–5.10)
RDW: 12.9 % (ref 11.0–15.0)
WBC: 5.1 10*3/uL (ref 3.8–10.8)

## 2018-09-12 LAB — RPR: RPR Ser Ql: NONREACTIVE

## 2018-09-17 ENCOUNTER — Encounter: Payer: Self-pay | Admitting: Internal Medicine

## 2018-09-17 ENCOUNTER — Ambulatory Visit (INDEPENDENT_AMBULATORY_CARE_PROVIDER_SITE_OTHER): Payer: Medicare Other | Admitting: Internal Medicine

## 2018-09-17 DIAGNOSIS — K591 Functional diarrhea: Secondary | ICD-10-CM

## 2018-09-17 DIAGNOSIS — B20 Human immunodeficiency virus [HIV] disease: Secondary | ICD-10-CM

## 2018-09-17 DIAGNOSIS — Z23 Encounter for immunization: Secondary | ICD-10-CM

## 2018-09-17 DIAGNOSIS — F3342 Major depressive disorder, recurrent, in full remission: Secondary | ICD-10-CM

## 2018-09-17 DIAGNOSIS — F17200 Nicotine dependence, unspecified, uncomplicated: Secondary | ICD-10-CM | POA: Diagnosis not present

## 2018-09-17 MED ORDER — BICTEGRAVIR-EMTRICITAB-TENOFOV 50-200-25 MG PO TABS: 1.0000 | ORAL_TABLET | Freq: Every day | ORAL | 11 refills | Status: DC

## 2018-09-17 NOTE — Assessment & Plan Note (Signed)
Her infection is under excellent, long-term control.  I doubt that her chronic diarrhea is due to [facility] but offered to change her to [employer].  She is in agreement with that plan.  She will follow-up after lab work in 1 year.

## 2018-09-17 NOTE — Assessment & Plan Note (Signed)
Her depression is in remission. 

## 2018-09-17 NOTE — Progress Notes (Signed)
Patient Active Problem List   Diagnosis Date Noted  . Human immunodeficiency virus (HIV) disease (HCC) 10/27/2006    Priority: High  . Dyslipidemia 03/29/2012    Priority: Medium  . Essential hypertension, benign 07/09/2010    Priority: Medium  . TOBACCO USER 03/19/2010    Priority: Medium  . Depression 03/19/2010    Priority: Medium  . DISORDERS, OBSESSIVE-COMPULSIVE 10/27/2006    Priority: Medium  . GERD (gastroesophageal reflux disease) 09/19/2017  . Abnormal nuclear stress test 03/23/2016  . Chest pain with moderate risk for cardiac etiology 03/23/2016  . Pain in the chest 02/16/2016  . SOB (shortness of breath) on exertion 02/16/2016  . Hyperlipidemia 02/16/2016  . Acute upper respiratory infection 02/02/2016  . Snoring 11/03/2014  . Chest pain 03/26/2012  . Chronic nausea 07/12/2011  . Insomnia 07/12/2011  . SYMPTOM, DIARRHEA NOS 08/10/2007  . PNEUMOCOCCAL PNEUMONIA 02/12/2007  . Degenerative disc disease 10/27/2006    Patient's Medications  New Prescriptions   BICTEGRAVIR-EMTRICITABINE-TENOFOVIR AF (BIKTARVY) 50-200-25 MG TABS TABLET    Take 1 tablet by mouth daily.  Previous Medications   ASPIRIN EC 81 MG TABLET    Take 1 tablet (81 mg total) by mouth daily.   ATENOLOL (TENORMIN) 50 MG TABLET    TAKE 1 TABLET(50 MG) BY MOUTH DAILY   ATENOLOL (TENORMIN) 50 MG TABLET    TAKE 1 TABLET(50 MG) BY MOUTH DAILY   ATORVASTATIN (LIPITOR) 10 MG TABLET    TAKE 1 TABLET(10 MG) BY MOUTH DAILY   CHANTIX CONTINUING MONTH PAK 1 MG TABLET    TAKE 1 TABLET BY MOUTH TWICE DAILY( EVERY TWELVE HOURS)   CITALOPRAM (CELEXA) 40 MG TABLET    TAKE 1 TABLET(40 MG) BY MOUTH DAILY   FOLIC ACID (FOLVITE) 400 MCG TABLET    Take 400 mcg by mouth daily.     ISOSORBIDE MONONITRATE (IMDUR) 30 MG 24 HR TABLET    Take 1 tablet (30 mg total) by mouth daily. Take 1/2 tablet once daily   NITROGLYCERIN (NITROSTAT) 0.4 MG SL TABLET    Place 1 tablet (0.4 mg total) under the tongue every 5  (five) minutes as needed for chest pain.   PANTOPRAZOLE (PROTONIX) 20 MG TABLET    TAKE 1 TABLET(20 MG) BY MOUTH DAILY   POTASSIUM CHLORIDE (KLOR-CON) 20 MEQ PACKET    Take 20 mEq by mouth 2 (two) times daily.   VARENICLINE (CHANTIX STARTING MONTH PAK) 0.5 MG X 11 & 1 MG X 42 TABLET    USE AS DIRECTED  Modified Medications   No medications on file  Discontinued Medications   GENVOYA 150-150-200-10 MG TABS TABLET    TAKE 1 TABLET BY MOUTH DAILY WITH BREAKFAST    Subjective: Dawn Byrd is in for her routine follow-up visit.  She has had no problems obtaining, taking or tolerating her Genvoya and cannot recall missing any doses.  She was able to quit smoking cigarettes after she tried Chantix last year.  She quit for 3 months then started back smoking.  She is back up to 1 pack daily but wants to try quitting again soon.  She felt much better when she was not smoking.  She says that she could not tolerate her acid reflux symptoms when she tried stopping Protonix so she is still taking it.  She continues to be bothered by diarrhea.  She takes Imodium occasionally and it does help.  Review of Systems: Review of Systems  Constitutional:  Negative for chills, diaphoresis, fever, malaise/fatigue and weight loss.  HENT: Negative for sore throat.   Respiratory: Negative for cough, sputum production and shortness of breath.   Cardiovascular: Negative for chest pain.  Gastrointestinal: Positive for diarrhea and heartburn. Negative for abdominal pain, constipation, nausea and vomiting.  Genitourinary: Negative for dysuria and frequency.  Musculoskeletal: Negative for joint pain and myalgias.  Skin: Negative for rash.  Neurological: Negative for dizziness and headaches.  Psychiatric/Behavioral: Negative for depression and substance abuse. The patient is not nervous/anxious.     Past Medical History:  Diagnosis Date  . Chest pain, unspecified   . HIV (human immunodeficiency virus infection) (HCC)   .  Hyperlipidemia   . Hypertension   . Nerve disorder     Social History   Tobacco Use  . Smoking status: Current Every Day Smoker    Packs/day: 1.00    Years: 30.00    Pack years: 30.00    Types: Cigarettes  . Smokeless tobacco: Never Used  . Tobacco comment: Interested in Chantix, never filled the rx  Substance Use Topics  . Alcohol use: Yes    Alcohol/week: 2.0 standard drinks    Types: 2 Standard drinks or equivalent per week    Comment: occ  . Drug use: Yes    Types: Marijuana    Comment: occ    Family History  Problem Relation Age of Onset  . Other Mother   . CVA Father   . Atrial fibrillation Father     No Known Allergies  Health Maintenance  Topic Date Due  . TETANUS/TDAP  08/31/1977  . COLONOSCOPY  08/31/2008  . PAP SMEAR  02/19/2011  . MAMMOGRAM  08/02/2015  . INFLUENZA VACCINE  07/26/2018  . Hepatitis C Screening  Completed  . HIV Screening  Completed    Objective:  Vitals:   09/17/18 1047  BP: (!) 161/81  Pulse: 69  Resp: 20  Temp: 98.3 F (36.8 C)  TempSrc: Oral  SpO2: 100%  Weight: 143 lb 12 oz (65.2 kg)  Height: 5\' 6"  (1.676 m)   Body mass index is 23.2 kg/m.  Physical Exam  Constitutional: She is oriented to person, place, and time.  She is smiling and in good spirits as usual.  HENT:  Mouth/Throat: No oropharyngeal exudate.  Cardiovascular: Normal rate, regular rhythm and normal heart sounds.  No murmur heard. Pulmonary/Chest: Effort normal and breath sounds normal.  Abdominal: Soft. She exhibits no distension. There is no tenderness.  Musculoskeletal: Normal range of motion. She exhibits no edema or tenderness.  Neurological: She is alert and oriented to person, place, and time.  Skin: No rash noted.  Psychiatric: She has a normal mood and affect.    Lab Results Lab Results  Component Value Date   WBC 5.1 09/10/2018   HGB 14.8 09/10/2018   HCT 42.5 09/10/2018   MCV 95.1 09/10/2018   PLT 202 09/10/2018    Lab Results    Component Value Date   CREATININE 0.77 09/10/2018   BUN 17 09/10/2018   NA 139 09/10/2018   K 5.0 09/10/2018   CL 104 09/10/2018   CO2 26 09/10/2018    Lab Results  Component Value Date   ALT 10 09/10/2018   AST 16 09/10/2018   ALKPHOS 53 07/05/2017   BILITOT 0.5 09/10/2018    Lab Results  Component Value Date   CHOL 181 09/10/2018   HDL 53 09/10/2018   LDLCALC 101 (H) 09/10/2018   TRIG 163 (H)  09/10/2018   CHOLHDL 3.4 09/10/2018   Lab Results  Component Value Date   LABRPR NON-REACTIVE 09/10/2018   HIV 1 RNA Quant (copies/mL)  Date Value  09/10/2018 <20 NOT DETECTED  07/05/2017 27 (H)  08/23/2016 <20   CD4 T Cell Abs (/uL)  Date Value  09/10/2018 780  07/05/2017 890  08/23/2016 920     Problem List Items Addressed This Visit      High   Human immunodeficiency virus (HIV) disease (HCC)    Her infection is under excellent, long-term control.  I doubt that her chronic diarrhea is due to [facility] but offered to change her to [employer].  She is in agreement with that plan.  She will follow-up after lab work in 1 year.      Relevant Medications   bictegravir-emtricitabine-tenofovir AF (BIKTARVY) 50-200-25 MG TABS tablet   Other Relevant Orders   HIV-1 RNA quant-no reflex-bld   RPR   Lipid panel   Comprehensive metabolic panel   T-helper cell (CD4)- ([facility] clinic only)   CBC     Medium   TOBACCO USER    I encouraged her to go through with her plan to try to quit smoking again.      Depression    Her depression is in remission.        Unprioritized   SYMPTOM, DIARRHEA NOS    I am changing Genvoya to Biktarvy to see if that helps.  She can try taking a small dose of Imodium every morning.           Cliffton AstersJohn Kazuo Durnil, MD [facility] [phone] pager   [phone] cell 09/17/2018, 11:08 AM

## 2018-09-17 NOTE — Assessment & Plan Note (Signed)
I am changing Genvoya to Biktarvy to see if that helps.  She can try taking a small dose of Imodium every morning.

## 2018-09-17 NOTE — Assessment & Plan Note (Signed)
I encouraged her to go through with her plan to try to quit smoking again.

## 2018-10-05 ENCOUNTER — Other Ambulatory Visit: Payer: Self-pay | Admitting: Internal Medicine

## 2018-10-05 ENCOUNTER — Other Ambulatory Visit: Payer: Self-pay | Admitting: Behavioral Health

## 2018-10-05 DIAGNOSIS — I10 Essential (primary) hypertension: Secondary | ICD-10-CM

## 2018-10-05 DIAGNOSIS — B20 Human immunodeficiency virus [HIV] disease: Secondary | ICD-10-CM

## 2018-10-05 MED ORDER — ATENOLOL 50 MG PO TABS: ORAL_TABLET | ORAL | 6 refills | Status: DC

## 2018-12-28 ENCOUNTER — Encounter: Payer: Self-pay | Admitting: Family

## 2018-12-28 ENCOUNTER — Ambulatory Visit: Payer: Medicare Other | Admitting: Family

## 2018-12-28 VITALS — BP 157/77 | HR 64 | Temp 98.3°F | Wt 146.0 lb

## 2018-12-28 DIAGNOSIS — J019 Acute sinusitis, unspecified: Secondary | ICD-10-CM | POA: Diagnosis not present

## 2018-12-28 MED ORDER — AMOXICILLIN-POT CLAVULANATE 875-125 MG PO TABS: 1.0000 | ORAL_TABLET | Freq: Two times a day (BID) | ORAL | 0 refills | Status: AC

## 2018-12-28 NOTE — Progress Notes (Signed)
Subjective:    Patient ID: Dawn HohDebra T Sarin, female    DOB: 08/02/1956, 63 y.o.   MRN: [id number]  Chief Complaint  Patient presents with  . URI     HPI:  Dawn Byrd is a 63 y.o. female who presents today for an acute office visit.  This is a new problem. Associated symptom of cough has been going on for about 3 weeks. Cough is described as productive that is generally greenish in color in the morning and clears throughout the day. Has had some blood streaks in her mucus a couple of times. Modifying factors include cough drops which do not help very much. . Has also had sore throat and fatigue. No fevers or chills. Overall does not really feel bad. Does continue to smoke about 1 pack per day on average. No other sick contacts. Course of the symptoms has stayed about the same since initial onset with no real improvements.   No Known Allergies    Outpatient Medications Prior to Visit  Medication Sig Dispense Refill  . atenolol (TENORMIN) 50 MG tablet TAKE 1 TABLET(50 MG) BY MOUTH DAILY 30 tablet 6  . atorvastatin (LIPITOR) 10 MG tablet TAKE 1 TABLET(10 MG) BY MOUTH DAILY 30 tablet 5  . bictegravir-emtricitabine-tenofovir AF (BIKTARVY) 50-200-25 MG TABS tablet Take 1 tablet by mouth daily. 30 tablet 11  . citalopram (CELEXA) 40 MG tablet TAKE 1 TABLET(40 MG) BY MOUTH DAILY 30 tablet 5  . folic acid (FOLVITE) 400 MCG tablet Take 400 mcg by mouth daily.      . isosorbide mononitrate (IMDUR) 30 MG 24 hr tablet Take 1 tablet (30 mg total) by mouth daily. Take 1/2 tablet once daily 30 tablet 6  . nitroGLYCERIN (NITROSTAT) 0.4 MG SL tablet Place 1 tablet (0.4 mg total) under the tongue every 5 (five) minutes as needed for chest pain. 25 tablet 3  . pantoprazole (PROTONIX) 20 MG tablet TAKE 1 TABLET(20 MG) BY MOUTH DAILY 30 tablet 5  . potassium chloride (KLOR-CON) 20 MEQ packet Take 20 mEq by mouth 2 (two) times daily.    . CHANTIX CONTINUING MONTH PAK 1 MG tablet TAKE 1 TABLET BY MOUTH TWICE  DAILY( EVERY TWELVE HOURS) (Patient not taking: Reported on 09/17/2018) 56 tablet 0  . varenicline (CHANTIX STARTING MONTH PAK) 0.5 MG X 11 & 1 MG X 42 tablet USE AS DIRECTED (Patient not taking: Reported on 09/17/2018) 53 tablet 0  . aspirin EC 81 MG tablet Take 1 tablet (81 mg total) by mouth daily. (Patient not taking: Reported on 09/17/2018)     Facility-Administered Medications Prior to Visit  Medication Dose Route Frequency Provider Last Rate Last Dose  . pneumococcal 23 valent vaccine (PNU-IMMUNE) injection 0.5 mL  0.5 mL Intramuscular Once Carolin GuernseyFarrington, Bradford A, NP         Past Medical History:  Diagnosis Date  . Chest pain, unspecified   . HIV (human immunodeficiency virus infection) (HCC)   . Hyperlipidemia   . Hypertension   . Nerve disorder      Past Surgical History:  Procedure Laterality Date  . ABDOMINAL HYSTERECTOMY    . BACK SURGERY    . BLADDER SUSPENSION    . CARDIAC CATHETERIZATION Left 03/23/2016   Procedure: Left Heart Cath and Coronary Angiography;  Surgeon: Marykay Lexavid W Harding, MD;  [facility];  Service: Cardiovascular;  Laterality: Left;  . rupture disc neck  12/2006     Review of Systems  Constitutional: Positive for  fatigue. Negative for chills and fever.  HENT: Positive for congestion, sinus pressure, sinus pain and sore throat. Negative for ear pain.   Respiratory: Positive for cough. Negative for chest tightness and wheezing.   Gastrointestinal: Negative for abdominal pain, nausea and vomiting.  Neurological: Positive for headaches.      Objective:    BP (!) 157/77   Pulse 64   Temp 98.3 F (36.8 C) (Oral)   Wt 146 lb (66.2 kg)   BMI 23.57 kg/m  Nursing note and vital signs reviewed.  Physical Exam Constitutional:      Appearance: She is well-developed.  HENT:     Right Ear: Hearing, tympanic membrane, ear canal and external ear normal.     Left Ear: Hearing, tympanic membrane, ear canal and external ear normal.      Nose:     Right Sinus: No maxillary sinus tenderness or frontal sinus tenderness.     Left Sinus: No maxillary sinus tenderness or frontal sinus tenderness.     Mouth/Throat:     Pharynx: Uvula midline.  Neck:     Musculoskeletal: Neck supple.  Cardiovascular:     Rate and Rhythm: Normal rate and regular rhythm.     Heart sounds: Normal heart sounds. No murmur. No friction rub. No gallop.   Pulmonary:     Effort: Pulmonary effort is normal.     Breath sounds: Normal breath sounds. No stridor. No wheezing, rhonchi or rales.  Skin:    General: Skin is warm and dry.  Neurological:     Mental Status: She is alert and oriented to person, place, and time.        Assessment & Plan:   Problem List Items Addressed This Visit      Respiratory   Acute non-recurrent sinusitis - Primary    Dawn Byrd has signs and symptoms consistent with sinusitis and upper respiratory infection that has been going on for 3 weeks without improvement. Start Augmentin for sinus infection and continue over the counter medications as needed for symptom relief and supportive care. I have also encouraged her to work on tobacco cessation as inflammation is likely a contributing factor to her cough.       Relevant Medications   amoxicillin-clavulanate (AUGMENTIN) 875-125 MG tablet       I have discontinued Jazminn T. Gruner's aspirin EC. I am also having her start on amoxicillin-clavulanate. Additionally, I am having her maintain her folic acid, potassium chloride, nitroGLYCERIN, isosorbide mononitrate, varenicline, CHANTIX CONTINUING MONTH PAK, pantoprazole, citalopram, atorvastatin, bictegravir-emtricitabine-tenofovir AF, and atenolol. We will continue to administer pneumococcal 23 valent vaccine.   Meds ordered this encounter  Medications  . amoxicillin-clavulanate (AUGMENTIN) 875-125 MG tablet    Sig: Take 1 tablet by mouth 2 (two) times daily.    Dispense:  14 tablet    Refill:  0    Order Specific Question:    Supervising Provider    Answer:   Judyann Munson [[id number]]     Follow-up: For HIV as scheduled with Dr. Urban Gibson, MSN, FNP-C Nurse Practitioner [facility] Office phone: [phone] Pager: [phone] [facility] Main number: [phone]

## 2018-12-28 NOTE — Patient Instructions (Addendum)
Nice to meet you.  A prescription for Augmentin has been sent to your pharmacy.  Continue with over the counter medications as needed for symptom relief as below.   Follow up if your symptoms worsen or do not improve.   General Recommendations:    Please drink plenty of fluids.  Get plenty of rest   Sleep in humidified air  Use saline nasal sprays  Netti pot   OTC Medications:  Decongestants - helps relieve congestion   Flonase (generic fluticasone) or Nasacort (generic triamcinolone) - please make sure to use the "cross-over" technique at a 45 degree angle towards the opposite eye as opposed to straight up the nasal passageway.   Sudafed (generic pseudoephedrine - Note this is the one that is available behind the pharmacy counter); Products with phenylephrine (-PE) may also be used but is often not as effective as pseudoephedrine.   If you have HIGH BLOOD PRESSURE - Coricidin HBP; AVOID any product that is -D as this contains pseudoephedrine which may increase your blood pressure.  Afrin (oxymetazoline) every 6-8 hours for up to 3 days.   Allergies - helps relieve runny nose, itchy eyes and sneezing   Claritin (generic loratidine), Allegra (fexofenidine), or Zyrtec (generic cyrterizine) for runny nose. These medications should not cause drowsiness.  Note - Benadryl (generic diphenhydramine) may be used however may cause drowsiness  Cough -   Delsym or Robitussin (generic dextromethorphan)  Expectorants - helps loosen mucus to ease removal   Mucinex (generic guaifenesin) as directed on the package.  Headaches / General Aches   Tylenol (generic acetaminophen) - DO NOT EXCEED 3 grams (3,000 mg) in a 24 hour time period  Advil/Motrin (generic ibuprofen)   Sore Throat -   Salt water gargle   Chloraseptic (generic benzocaine) spray or lozenges / Sucrets (generic dyclonine)

## 2018-12-28 NOTE — Assessment & Plan Note (Signed)
Ms. Reidt has signs and symptoms consistent with sinusitis and upper respiratory infection that has been going on for 3 weeks without improvement. Start Augmentin for sinus infection and continue over the counter medications as needed for symptom relief and supportive care. I have also encouraged her to work on tobacco cessation as inflammation is likely a contributing factor to her cough.

## 2018-12-31 ENCOUNTER — Ambulatory Visit: Payer: Medicare Other

## 2019-01-01 ENCOUNTER — Encounter: Payer: Self-pay | Admitting: Internal Medicine

## 2019-03-14 ENCOUNTER — Other Ambulatory Visit: Payer: Self-pay | Admitting: Internal Medicine

## 2019-03-14 DIAGNOSIS — E7800 Pure hypercholesterolemia, unspecified: Secondary | ICD-10-CM

## 2019-03-14 DIAGNOSIS — F32A Depression, unspecified: Secondary | ICD-10-CM

## 2019-03-14 DIAGNOSIS — F329 Major depressive disorder, single episode, unspecified: Secondary | ICD-10-CM

## 2019-03-19 ENCOUNTER — Other Ambulatory Visit: Payer: Self-pay | Admitting: Internal Medicine

## 2019-03-19 DIAGNOSIS — R079 Chest pain, unspecified: Secondary | ICD-10-CM

## 2019-05-13 ENCOUNTER — Other Ambulatory Visit: Payer: Self-pay | Admitting: Internal Medicine

## 2019-05-13 DIAGNOSIS — I10 Essential (primary) hypertension: Secondary | ICD-10-CM

## 2019-07-17 ENCOUNTER — Encounter: Payer: Self-pay | Admitting: Internal Medicine

## 2019-09-02 ENCOUNTER — Other Ambulatory Visit: Payer: Self-pay | Admitting: Internal Medicine

## 2019-09-02 DIAGNOSIS — R079 Chest pain, unspecified: Secondary | ICD-10-CM

## 2019-09-04 ENCOUNTER — Other Ambulatory Visit: Payer: Medicare Other

## 2019-09-04 ENCOUNTER — Other Ambulatory Visit: Payer: Self-pay

## 2019-09-04 ENCOUNTER — Other Ambulatory Visit: Payer: Self-pay | Admitting: Internal Medicine

## 2019-09-04 DIAGNOSIS — F329 Major depressive disorder, single episode, unspecified: Secondary | ICD-10-CM

## 2019-09-04 DIAGNOSIS — B20 Human immunodeficiency virus [HIV] disease: Secondary | ICD-10-CM

## 2019-09-04 DIAGNOSIS — E7800 Pure hypercholesterolemia, unspecified: Secondary | ICD-10-CM

## 2019-09-04 DIAGNOSIS — E785 Hyperlipidemia, unspecified: Secondary | ICD-10-CM

## 2019-09-04 DIAGNOSIS — F32A Depression, unspecified: Secondary | ICD-10-CM

## 2019-09-05 LAB — T-HELPER CELL (CD4) - (RCID CLINIC ONLY)
CD4 % Helper T Cell: 32 % — ABNORMAL LOW (ref 33–65)
CD4 T Cell Abs: 673 /uL (ref 400–1790)

## 2019-09-10 LAB — CBC
HCT: 40.3 % (ref 35.0–45.0)
Hemoglobin: 13.8 g/dL (ref 11.7–15.5)
MCH: 32.9 pg (ref 27.0–33.0)
MCHC: 34.2 g/dL (ref 32.0–36.0)
MCV: 96 fL (ref 80.0–100.0)
MPV: 11 fL (ref 7.5–12.5)
Platelets: 166 10*3/uL (ref 140–400)
RBC: 4.2 10*6/uL (ref 3.80–5.10)
RDW: 12.6 % (ref 11.0–15.0)
WBC: 4.5 10*3/uL (ref 3.8–10.8)

## 2019-09-10 LAB — COMPREHENSIVE METABOLIC PANEL
AG Ratio: 1.8 (calc) (ref 1.0–2.5)
ALT: 14 U/L (ref 6–29)
AST: 20 U/L (ref 10–35)
Albumin: 4.4 g/dL (ref 3.6–5.1)
Alkaline phosphatase (APISO): 52 U/L (ref 37–153)
BUN: 8 mg/dL (ref 7–25)
CO2: 30 mmol/L (ref 20–32)
Calcium: 9.2 mg/dL (ref 8.6–10.4)
Chloride: 105 mmol/L (ref 98–110)
Creat: 0.79 mg/dL (ref 0.50–0.99)
Globulin: 2.4 g/dL (calc) (ref 1.9–3.7)
Glucose, Bld: 111 mg/dL — ABNORMAL HIGH (ref 65–99)
Potassium: 4.5 mmol/L (ref 3.5–5.3)
Sodium: 140 mmol/L (ref 135–146)
Total Bilirubin: 0.6 mg/dL (ref 0.2–1.2)
Total Protein: 6.8 g/dL (ref 6.1–8.1)

## 2019-09-10 LAB — LIPID PANEL
Cholesterol: 170 mg/dL (ref <200)
HDL: 49 mg/dL — ABNORMAL LOW (ref >=50)
LDL Cholesterol (Calc): 100 mg/dL (calc) — ABNORMAL HIGH
Non-HDL Cholesterol (Calc): 121 mg/dL (calc) (ref <130)
Total CHOL/HDL Ratio: 3.5 (calc) (ref <5.0)
Triglycerides: 113 mg/dL (ref <150)

## 2019-09-10 LAB — HIV-1 RNA QUANT-NO REFLEX-BLD
HIV 1 RNA Quant: <20 copies/mL
HIV-1 RNA Quant, Log: <1.3 Log copies/mL

## 2019-09-10 LAB — RPR: RPR Ser Ql: NONREACTIVE

## 2019-09-18 ENCOUNTER — Other Ambulatory Visit: Payer: Self-pay

## 2019-09-18 ENCOUNTER — Ambulatory Visit (INDEPENDENT_AMBULATORY_CARE_PROVIDER_SITE_OTHER): Payer: Medicare Other | Admitting: Internal Medicine

## 2019-09-18 DIAGNOSIS — Z23 Encounter for immunization: Secondary | ICD-10-CM

## 2019-09-18 DIAGNOSIS — F17200 Nicotine dependence, unspecified, uncomplicated: Secondary | ICD-10-CM | POA: Diagnosis not present

## 2019-09-18 DIAGNOSIS — I10 Essential (primary) hypertension: Secondary | ICD-10-CM

## 2019-09-18 DIAGNOSIS — F3340 Major depressive disorder, recurrent, in remission, unspecified: Secondary | ICD-10-CM | POA: Diagnosis not present

## 2019-09-18 DIAGNOSIS — B20 Human immunodeficiency virus [HIV] disease: Secondary | ICD-10-CM | POA: Diagnosis not present

## 2019-09-18 NOTE — Assessment & Plan Note (Signed)
Her infection remains under excellent, long-term control.  She will continue Biktarvy and follow-up after lab work in 1 year.  She received her influenza vaccine today.

## 2019-09-18 NOTE — Assessment & Plan Note (Signed)
Her depression is in remission. 

## 2019-09-18 NOTE — Progress Notes (Signed)
Patient Active Problem List   Diagnosis Date Noted  . Human immunodeficiency virus (HIV) disease (HCC) 10/27/2006    Priority: High  . Dyslipidemia 03/29/2012    Priority: Medium  . Essential hypertension, benign 07/09/2010    Priority: Medium  . TOBACCO USER 03/19/2010    Priority: Medium  . Depression 03/19/2010    Priority: Medium  . DISORDERS, OBSESSIVE-COMPULSIVE 10/27/2006    Priority: Medium  . Acute non-recurrent sinusitis 12/28/2018  . GERD (gastroesophageal reflux disease) 09/19/2017  . Abnormal nuclear stress test 03/23/2016  . Chest pain with moderate risk for cardiac etiology 03/23/2016  . Pain in the chest 02/16/2016  . SOB (shortness of breath) on exertion 02/16/2016  . Hyperlipidemia 02/16/2016  . Acute upper respiratory infection 02/02/2016  . Snoring 11/03/2014  . Chest pain 03/26/2012  . Chronic nausea 07/12/2011  . Insomnia 07/12/2011  . SYMPTOM, DIARRHEA NOS 08/10/2007  . PNEUMOCOCCAL PNEUMONIA 02/12/2007  . Degenerative disc disease 10/27/2006    Patient's Medications  New Prescriptions   No medications on file  Previous Medications   AMOXICILLIN-CLAVULANATE (AUGMENTIN) 875-125 MG TABLET    Take 1 tablet by mouth 2 (two) times daily.   ATENOLOL (TENORMIN) 50 MG TABLET    TAKE 1 TABLET(50 MG) BY MOUTH DAILY   ATORVASTATIN (LIPITOR) 10 MG TABLET    TAKE 1 TABLET(10 MG) BY MOUTH DAILY   BICTEGRAVIR-EMTRICITABINE-TENOFOVIR AF (BIKTARVY) 50-200-25 MG TABS TABLET    Take 1 tablet by mouth daily.   CHANTIX CONTINUING MONTH PAK 1 MG TABLET    TAKE 1 TABLET BY MOUTH TWICE DAILY( EVERY TWELVE HOURS)   CITALOPRAM (CELEXA) 40 MG TABLET    TAKE 1 TABLET(40 MG) BY MOUTH DAILY   FOLIC ACID (FOLVITE) 400 MCG TABLET    Take 400 mcg by mouth daily.     ISOSORBIDE MONONITRATE (IMDUR) 30 MG 24 HR TABLET    Take 1 tablet (30 mg total) by mouth daily. Take 1/2 tablet once daily   NITROGLYCERIN (NITROSTAT) 0.4 MG SL TABLET    Place 1 tablet (0.4 mg total)  under the tongue every 5 (five) minutes as needed for chest pain.   PANTOPRAZOLE (PROTONIX) 20 MG TABLET    TAKE 1 TABLET(20 MG) BY MOUTH DAILY   POTASSIUM CHLORIDE (KLOR-CON) 20 MEQ PACKET    Take 20 mEq by mouth 2 (two) times daily.   VARENICLINE (CHANTIX STARTING MONTH PAK) 0.5 MG X 11 & 1 MG X 42 TABLET    USE AS DIRECTED  Modified Medications   No medications on file  Discontinued Medications   No medications on file    Subjective: Dawn Byrd is in for her routine HIV follow-up visit.  She has not had any problems obtaining, taking or tolerating Biktarvy and does not recall missing any doses.  She is feeling well.  She is still smoking about a pack and a half of cigarettes daily.  She did quit for 3 years about 30 years ago.  She quit cold [place].  She would like to quit but does not have a plan.  She has not had any increased anxiety or depression during the COVID pandemic.  She does not have a PCP.  Review of Systems: Review of Systems  Constitutional: Negative for chills, diaphoresis, fever, malaise/fatigue and weight loss.  HENT: Negative for sore throat.   Respiratory: Positive for cough. Negative for sputum production and shortness of breath.   Cardiovascular: Negative for chest pain.  Gastrointestinal: Negative for abdominal pain, diarrhea, heartburn, nausea and vomiting.  Genitourinary: Negative for dysuria and frequency.  Musculoskeletal: Negative for joint pain and myalgias.  Skin: Negative for rash.  Neurological: Negative for dizziness and headaches.  Psychiatric/Behavioral: Negative for depression and substance abuse. The patient is not nervous/anxious.     Past Medical History:  Diagnosis Date  . Chest pain, unspecified   . HIV (human immunodeficiency virus infection) ([place])   . Hyperlipidemia   . Hypertension   . Nerve disorder     Social History   Tobacco Use  . Smoking status: Current Every Day Smoker    Packs/day: 1.00    Years: 30.00    Pack years: 30.00     Types: Cigarettes  . Smokeless tobacco: Never Used  . Tobacco comment: Interested in Chantix, never filled the rx  Substance Use Topics  . Alcohol use: Yes    Alcohol/week: 2.0 standard drinks    Types: 2 Standard drinks or equivalent per week    Comment: occ  . Drug use: Yes    Types: Marijuana    Comment: occ    Family History  Problem Relation Age of Onset  . Other Mother   . CVA Father   . Atrial fibrillation Father     No Known Allergies  Health Maintenance  Topic Date Due  . TETANUS/TDAP  08/31/1977  . COLONOSCOPY  08/31/2008  . PAP SMEAR-Modifier  02/19/2011  . MAMMOGRAM  08/02/2015  . INFLUENZA VACCINE  07/27/2019  . Hepatitis C Screening  Completed  . HIV Screening  Completed    Objective:  Vitals:   09/18/19 0901  BP: (!) 177/91  Pulse: 64   There is no height or weight on file to calculate BMI.  Physical Exam Constitutional:      Comments: She is in good spirits.  HENT:     Mouth/Throat:     Pharynx: No oropharyngeal exudate.  Eyes:     Conjunctiva/sclera: Conjunctivae normal.  Cardiovascular:     Rate and Rhythm: Normal rate and regular rhythm.     Heart sounds: No murmur.  Pulmonary:     Breath sounds: Normal breath sounds.  Abdominal:     Palpations: Abdomen is soft. There is no mass.     Tenderness: There is no abdominal tenderness.  Musculoskeletal: Normal range of motion.  Skin:    Findings: No rash.  Neurological:     Mental Status: She is alert and oriented to person, place, and time.  Psychiatric:        Mood and Affect: Mood normal.     Lab Results Lab Results  Component Value Date   WBC 4.5 09/04/2019   HGB 13.8 09/04/2019   HCT 40.3 09/04/2019   MCV 96.0 09/04/2019   PLT 166 09/04/2019    Lab Results  Component Value Date   CREATININE 0.79 09/04/2019   BUN 8 09/04/2019   NA 140 09/04/2019   K 4.5 09/04/2019   CL 105 09/04/2019   CO2 30 09/04/2019    Lab Results  Component Value Date   ALT 14 09/04/2019    AST 20 09/04/2019   ALKPHOS 53 07/05/2017   BILITOT 0.6 09/04/2019    Lab Results  Component Value Date   CHOL 170 09/04/2019   HDL 49 (L) 09/04/2019   LDLCALC 100 (H) 09/04/2019   TRIG 113 09/04/2019   CHOLHDL 3.5 09/04/2019   Lab Results  Component Value Date   LABRPR NON-REACTIVE 09/04/2019  HIV 1 RNA Quant (copies/mL)  Date Value  09/04/2019 <20 NOT DETECTED  09/10/2018 <20 NOT DETECTED  07/05/2017 27 (H)   CD4 T Cell Abs (/uL)  Date Value  09/04/2019 673  09/10/2018 780  07/05/2017 890     Problem List Items Addressed This Visit      High   Human immunodeficiency virus (HIV) disease (HCC)    Her infection remains under excellent, long-term control.  She will continue Biktarvy and follow-up after lab work in 1 year.  She received her influenza vaccine today.      Relevant Orders   CBC   T-helper cell (CD4)- ([facility] clinic only)   Comprehensive metabolic panel   Lipid panel   RPR   HIV-1 RNA quant-no reflex-bld     Medium   TOBACCO USER    To her again about the importance of cigarette cessation.      Essential hypertension, benign (Chronic)    We will help her get a PCP.      Depression    Her depression is in remission.           Cliffton Asters, MD [facility] [phone] pager   [phone] cell 09/18/2019, 9:17 AM

## 2019-09-18 NOTE — Assessment & Plan Note (Signed)
We will help her get a PCP. 

## 2019-09-18 NOTE — Assessment & Plan Note (Signed)
To her again about the importance of cigarette cessation.

## 2019-09-30 ENCOUNTER — Other Ambulatory Visit: Payer: Self-pay | Admitting: Internal Medicine

## 2019-09-30 DIAGNOSIS — B20 Human immunodeficiency virus [HIV] disease: Secondary | ICD-10-CM

## 2019-11-24 ENCOUNTER — Other Ambulatory Visit: Payer: Self-pay | Admitting: Internal Medicine

## 2019-11-24 DIAGNOSIS — I10 Essential (primary) hypertension: Secondary | ICD-10-CM

## 2020-01-01 ENCOUNTER — Other Ambulatory Visit: Payer: Self-pay | Admitting: Internal Medicine

## 2020-01-01 DIAGNOSIS — Z1231 Encounter for screening mammogram for malignant neoplasm of breast: Secondary | ICD-10-CM

## 2020-01-23 ENCOUNTER — Ambulatory Visit: Payer: Medicare PPO

## 2020-01-23 ENCOUNTER — Other Ambulatory Visit: Payer: Self-pay

## 2020-02-11 ENCOUNTER — Other Ambulatory Visit: Payer: Self-pay

## 2020-02-11 ENCOUNTER — Ambulatory Visit
Admission: RE | Admit: 2020-02-11 | Discharge: 2020-02-11 | Disposition: A | Discharge status: Home / Self Care | Payer: Medicare PPO | Source: Ambulatory Visit | Attending: Internal Medicine | Admitting: Internal Medicine

## 2020-02-11 DIAGNOSIS — Z1231 Encounter for screening mammogram for malignant neoplasm of breast: Secondary | ICD-10-CM

## 2020-02-17 ENCOUNTER — Other Ambulatory Visit: Payer: Self-pay | Admitting: Internal Medicine

## 2020-02-17 DIAGNOSIS — E7800 Pure hypercholesterolemia, unspecified: Secondary | ICD-10-CM

## 2020-02-17 DIAGNOSIS — F32A Depression, unspecified: Secondary | ICD-10-CM

## 2020-02-17 DIAGNOSIS — R079 Chest pain, unspecified: Secondary | ICD-10-CM

## 2020-02-17 DIAGNOSIS — F329 Major depressive disorder, single episode, unspecified: Secondary | ICD-10-CM

## 2020-02-20 ENCOUNTER — Encounter: Payer: Self-pay | Admitting: Internal Medicine

## 2020-06-12 ENCOUNTER — Other Ambulatory Visit: Payer: Self-pay | Admitting: Internal Medicine

## 2020-06-12 DIAGNOSIS — I10 Essential (primary) hypertension: Secondary | ICD-10-CM

## 2020-07-16 ENCOUNTER — Ambulatory Visit: Payer: Medicare PPO

## 2020-07-20 ENCOUNTER — Other Ambulatory Visit: Payer: Self-pay

## 2020-07-20 ENCOUNTER — Ambulatory Visit: Payer: Medicare PPO

## 2020-07-23 ENCOUNTER — Encounter: Payer: Self-pay | Admitting: Internal Medicine

## 2020-08-18 ENCOUNTER — Other Ambulatory Visit: Payer: Self-pay | Admitting: Internal Medicine

## 2020-08-18 DIAGNOSIS — R079 Chest pain, unspecified: Secondary | ICD-10-CM

## 2020-08-18 DIAGNOSIS — F32A Depression, unspecified: Secondary | ICD-10-CM

## 2020-08-18 DIAGNOSIS — E7800 Pure hypercholesterolemia, unspecified: Secondary | ICD-10-CM

## 2020-09-13 ENCOUNTER — Other Ambulatory Visit: Payer: Self-pay | Admitting: Internal Medicine

## 2020-09-13 DIAGNOSIS — B20 Human immunodeficiency virus [HIV] disease: Secondary | ICD-10-CM

## 2020-09-17 ENCOUNTER — Other Ambulatory Visit: Payer: Medicare PPO

## 2020-09-17 ENCOUNTER — Other Ambulatory Visit: Payer: Self-pay | Admitting: Internal Medicine

## 2020-09-17 ENCOUNTER — Other Ambulatory Visit: Payer: Self-pay

## 2020-09-17 DIAGNOSIS — E7800 Pure hypercholesterolemia, unspecified: Secondary | ICD-10-CM

## 2020-09-17 DIAGNOSIS — F32A Depression, unspecified: Secondary | ICD-10-CM

## 2020-09-17 DIAGNOSIS — B20 Human immunodeficiency virus [HIV] disease: Secondary | ICD-10-CM

## 2020-09-17 DIAGNOSIS — R079 Chest pain, unspecified: Secondary | ICD-10-CM

## 2020-09-18 LAB — T-HELPER CELL (CD4) - (RCID CLINIC ONLY)
CD4 % Helper T Cell: 33 % (ref 33–65)
CD4 T Cell Abs: 720 /uL (ref 400–1790)

## 2020-09-21 LAB — LIPID PANEL
Cholesterol: 161 mg/dL (ref <200)
HDL: 40 mg/dL — ABNORMAL LOW (ref >=50)
LDL Cholesterol (Calc): 90 mg/dL (calc)
Non-HDL Cholesterol (Calc): 121 mg/dL (calc) (ref <130)
Total CHOL/HDL Ratio: 4 (calc) (ref <5.0)
Triglycerides: 211 mg/dL — ABNORMAL HIGH (ref <150)

## 2020-09-21 LAB — CBC
HCT: 38.4 % (ref 35.0–45.0)
Hemoglobin: 13 g/dL (ref 11.7–15.5)
MCH: 31.7 pg (ref 27.0–33.0)
MCHC: 33.9 g/dL (ref 32.0–36.0)
MCV: 93.7 fL (ref 80.0–100.0)
MPV: 10.9 fL (ref 7.5–12.5)
Platelets: 160 10*3/uL (ref 140–400)
RBC: 4.1 10*6/uL (ref 3.80–5.10)
RDW: 13.2 % (ref 11.0–15.0)
WBC: 4.5 10*3/uL (ref 3.8–10.8)

## 2020-09-21 LAB — COMPREHENSIVE METABOLIC PANEL
AG Ratio: 1.6 (calc) (ref 1.0–2.5)
ALT: 15 U/L (ref 6–29)
AST: 16 U/L (ref 10–35)
Albumin: 4.3 g/dL (ref 3.6–5.1)
Alkaline phosphatase (APISO): 50 U/L (ref 37–153)
BUN: 13 mg/dL (ref 7–25)
CO2: 28 mmol/L (ref 20–32)
Calcium: 9.4 mg/dL (ref 8.6–10.4)
Chloride: 105 mmol/L (ref 98–110)
Creat: 0.95 mg/dL (ref 0.50–0.99)
Globulin: 2.7 g/dL (calc) (ref 1.9–3.7)
Glucose, Bld: 91 mg/dL (ref 65–99)
Potassium: 4.8 mmol/L (ref 3.5–5.3)
Sodium: 140 mmol/L (ref 135–146)
Total Bilirubin: 0.4 mg/dL (ref 0.2–1.2)
Total Protein: 7 g/dL (ref 6.1–8.1)

## 2020-09-21 LAB — HIV-1 RNA QUANT-NO REFLEX-BLD
HIV 1 RNA Quant: <20 Copies/mL
HIV-1 RNA Quant, Log: <1.3 Log cps/mL

## 2020-09-21 LAB — RPR: RPR Ser Ql: NONREACTIVE

## 2020-09-22 ENCOUNTER — Other Ambulatory Visit: Payer: Self-pay

## 2020-09-22 DIAGNOSIS — B20 Human immunodeficiency virus [HIV] disease: Secondary | ICD-10-CM

## 2020-09-22 MED ORDER — BIKTARVY 50-200-25 MG PO TABS: 1.0000 | ORAL_TABLET | Freq: Every day | ORAL | 0 refills | Status: DC

## 2020-10-01 ENCOUNTER — Encounter: Payer: Self-pay | Admitting: Internal Medicine

## 2020-10-01 ENCOUNTER — Ambulatory Visit: Payer: Medicare PPO | Admitting: Internal Medicine

## 2020-10-01 ENCOUNTER — Other Ambulatory Visit: Payer: Self-pay

## 2020-10-01 VITALS — BP 135/73 | HR 66 | Temp 98.0°F | Wt 169.0 lb

## 2020-10-01 DIAGNOSIS — F3340 Major depressive disorder, recurrent, in remission, unspecified: Secondary | ICD-10-CM | POA: Diagnosis not present

## 2020-10-01 DIAGNOSIS — Z23 Encounter for immunization: Secondary | ICD-10-CM | POA: Diagnosis not present

## 2020-10-01 DIAGNOSIS — B20 Human immunodeficiency virus [HIV] disease: Secondary | ICD-10-CM

## 2020-10-01 DIAGNOSIS — F17200 Nicotine dependence, unspecified, uncomplicated: Secondary | ICD-10-CM | POA: Diagnosis not present

## 2020-10-01 MED ORDER — BIKTARVY 50-200-25 MG PO TABS: 1.0000 | ORAL_TABLET | Freq: Every day | ORAL | 11 refills | Status: AC

## 2020-10-01 NOTE — Assessment & Plan Note (Signed)
Her infection remains under excellent, long-term control. She received her influenza vaccine here today. She will continue Biktarvy and follow-up after lab work in 1 year.

## 2020-10-01 NOTE — Assessment & Plan Note (Signed)
I congratulated her on having quit smoking cigarettes.

## 2020-10-01 NOTE — Progress Notes (Signed)
Patient Active Problem List   Diagnosis Date Noted  . Human immunodeficiency virus (HIV) disease (HCC) 10/27/2006    Priority: High  . Dyslipidemia 03/29/2012    Priority: Medium  . Essential hypertension, benign 07/09/2010    Priority: Medium  . TOBACCO USER 03/19/2010    Priority: Medium  . Depression 03/19/2010    Priority: Medium  . DISORDERS, OBSESSIVE-COMPULSIVE 10/27/2006    Priority: Medium  . Acute non-recurrent sinusitis 12/28/2018  . GERD (gastroesophageal reflux disease) 09/19/2017  . Abnormal nuclear stress test 03/23/2016  . Chest pain with moderate risk for cardiac etiology 03/23/2016  . Pain in the chest 02/16/2016  . SOB (shortness of breath) on exertion 02/16/2016  . Hyperlipidemia 02/16/2016  . Acute upper respiratory infection 02/02/2016  . Snoring 11/03/2014  . Chest pain 03/26/2012  . Chronic nausea 07/12/2011  . Insomnia 07/12/2011  . SYMPTOM, DIARRHEA NOS 08/10/2007  . PNEUMOCOCCAL PNEUMONIA 02/12/2007  . Degenerative disc disease 10/27/2006    Patient's Medications  New Prescriptions   No medications on file  Previous Medications   AMOXICILLIN-CLAVULANATE (AUGMENTIN) 875-125 MG TABLET    Take 1 tablet by mouth 2 (two) times daily.   ATENOLOL (TENORMIN) 50 MG TABLET    TAKE 1 TABLET(50 MG) BY MOUTH DAILY   ATORVASTATIN (LIPITOR) 10 MG TABLET    TAKE 1 TABLET(10 MG) BY MOUTH DAILY   CHANTIX CONTINUING MONTH PAK 1 MG TABLET    TAKE 1 TABLET BY MOUTH TWICE DAILY( EVERY TWELVE HOURS)   CITALOPRAM (CELEXA) 40 MG TABLET    TAKE 1 TABLET(40 MG) BY MOUTH DAILY   FOLIC ACID (FOLVITE) 400 MCG TABLET    Take 400 mcg by mouth daily.     ISOSORBIDE MONONITRATE (IMDUR) 30 MG 24 HR TABLET    Take 1 tablet (30 mg total) by mouth daily. Take 1/2 tablet once daily   NITROGLYCERIN (NITROSTAT) 0.4 MG SL TABLET    Place 1 tablet (0.4 mg total) under the tongue every 5 (five) minutes as needed for chest pain.   PANTOPRAZOLE (PROTONIX) 20 MG TABLET    TAKE  1 TABLET BY MOUTH DAILY   POTASSIUM CHLORIDE (KLOR-CON) 20 MEQ PACKET    Take 20 mEq by mouth 2 (two) times daily.   VARENICLINE (CHANTIX STARTING MONTH PAK) 0.5 MG X 11 & 1 MG X 42 TABLET    USE AS DIRECTED  Modified Medications   Modified Medication Previous Medication   BICTEGRAVIR-EMTRICITABINE-TENOFOVIR AF (BIKTARVY) 50-200-25 MG TABS TABLET bictegravir-emtricitabine-tenofovir AF (BIKTARVY) 50-200-25 MG TABS tablet      Take 1 tablet by mouth daily.    Take 1 tablet by mouth daily.  Discontinued Medications   No medications on file    Subjective: Dawn Byrd is in for her routine HIV follow-up visit. She has not had any problems obtaining, taking or tolerating her Biktarvy and does not recall missing any doses. She quit smoking cigarettes for good a little bit after her last visit 1 year ago. She is feeling much better and coughing less. She has not taken a Covid vaccine yet. She says there are "just too many questions". She is not feeling anxious or depressed.  Review of Systems: Review of Systems  Constitutional: Negative for chills, diaphoresis, fever, malaise/fatigue and weight loss.  HENT: Negative for sore throat.   Respiratory: Positive for cough. Negative for sputum production and shortness of breath.   Cardiovascular: Negative for chest pain.  Gastrointestinal: Negative for abdominal  pain, diarrhea, heartburn, nausea and vomiting.  Genitourinary: Negative for dysuria and frequency.  Musculoskeletal: Negative for joint pain and myalgias.  Skin: Negative for rash.  Neurological: Negative for dizziness and headaches.  Psychiatric/Behavioral: Negative for depression and substance abuse. The patient is not nervous/anxious.     Past Medical History:  Diagnosis Date  . Chest pain, unspecified   . HIV (human immunodeficiency virus infection) (HCC)   . Hyperlipidemia   . Hypertension   . Nerve disorder     Social History   Tobacco Use  . Smoking status: Former Smoker     Packs/day: 1.00    Years: 30.00    Pack years: 30.00    Types: Cigarettes  . Smokeless tobacco: Never Used  . Tobacco comment: QUIT 09/2019  Vaping Use  . Vaping Use: Never used  Substance Use Topics  . Alcohol use: Yes    Alcohol/week: 2.0 standard drinks    Types: 2 Standard drinks or equivalent per week    Comment: occ  . Drug use: Not Currently    Types: Marijuana    Comment: occ    Family History  Problem Relation Age of Onset  . Other Mother   . CVA Father   . Atrial fibrillation Father     No Known Allergies  Health Maintenance  Topic Date Due  . COVID-19 Vaccine (1) Never done  . TETANUS/TDAP  Never done  . COLONOSCOPY  Never done  . PAP SMEAR-Modifier  02/19/2011  . INFLUENZA VACCINE  07/26/2020  . MAMMOGRAM  02/10/2022  . Hepatitis C Screening  Completed  . HIV Screening  Completed    Objective:  Vitals:   10/01/20 0900  BP: 135/73  Pulse: 66  Temp: 98 F (36.7 C)  TempSrc: Oral  Weight: 169 lb (76.7 kg)   Body mass index is 27.28 kg/m.  Physical Exam Constitutional:      Comments: She is in very good spirits.  Cardiovascular:     Rate and Rhythm: Normal rate and regular rhythm.     Heart sounds: No murmur heard.   Pulmonary:     Effort: Pulmonary effort is normal.     Breath sounds: Normal breath sounds.  Musculoskeletal:        General: No swelling or tenderness.  Skin:    Findings: No rash.  Neurological:     General: No focal deficit present.  Psychiatric:        Mood and Affect: Mood normal.     Lab Results Lab Results  Component Value Date   WBC 4.5 09/17/2020   HGB 13.0 09/17/2020   HCT 38.4 09/17/2020   MCV 93.7 09/17/2020   PLT 160 09/17/2020    Lab Results  Component Value Date   CREATININE 0.95 09/17/2020   BUN 13 09/17/2020   NA 140 09/17/2020   K 4.8 09/17/2020   CL 105 09/17/2020   CO2 28 09/17/2020    Lab Results  Component Value Date   ALT 15 09/17/2020   AST 16 09/17/2020   ALKPHOS 53 07/05/2017    BILITOT 0.4 09/17/2020    Lab Results  Component Value Date   CHOL 161 09/17/2020   HDL 40 (L) 09/17/2020   LDLCALC 90 09/17/2020   TRIG 211 (H) 09/17/2020   CHOLHDL 4.0 09/17/2020   Lab Results  Component Value Date   LABRPR NON-REACTIVE 09/17/2020   HIV 1 RNA Quant  Date Value  09/17/2020 <20 Copies/mL  09/04/2019 <20 NOT DETECTED copies/mL  09/10/2018 <20 NOT DETECTED copies/mL   CD4 T Cell Abs (/uL)  Date Value  09/17/2020 720  09/04/2019 673  09/10/2018 780     Problem List Items Addressed This Visit      High   Human immunodeficiency virus (HIV) disease (HCC)    Her infection remains under excellent, long-term control. She received her influenza vaccine here today. She will continue Biktarvy and follow-up after lab work in 1 year.      Relevant Medications   bictegravir-emtricitabine-tenofovir AF (BIKTARVY) 50-200-25 MG TABS tablet   Other Relevant Orders   CBC   T-helper cell (CD4)- ([facility] clinic only)   Comprehensive metabolic panel   Lipid panel   RPR   HIV-1 RNA quant-no reflex-bld     Medium   TOBACCO USER    I congratulated her on having quit smoking cigarettes.      Depression    Her depression remains in remission.           Cliffton Asters, MD [facility] for [facility]ious Disease [facility] [phone] pager   [phone] cell 10/01/2020, 9:13 AM

## 2020-10-01 NOTE — Assessment & Plan Note (Signed)
Her depression remains in remission. 

## 2020-10-17 ENCOUNTER — Other Ambulatory Visit: Payer: Self-pay | Admitting: Internal Medicine

## 2020-10-17 DIAGNOSIS — R079 Chest pain, unspecified: Secondary | ICD-10-CM

## 2020-10-17 DIAGNOSIS — F32A Depression, unspecified: Secondary | ICD-10-CM

## 2020-10-17 DIAGNOSIS — E7800 Pure hypercholesterolemia, unspecified: Secondary | ICD-10-CM

## 2020-10-19 ENCOUNTER — Telehealth: Payer: Self-pay | Admitting: *Deleted

## 2020-10-19 NOTE — Telephone Encounter (Signed)
Received refill request for celexa, atorvastin, protonix. RN  Reached out to patient to confirm if she is still taking these, as Dr Orvan Falconer noted her depression resolved at last visit. Patient stated she does take these still.  RN sent refills, offered to help connect her to primary care for future management of these prescriptions. Andree Coss, RN

## 2021-01-15 ENCOUNTER — Other Ambulatory Visit: Payer: Self-pay | Admitting: Internal Medicine

## 2021-01-15 DIAGNOSIS — I10 Essential (primary) hypertension: Secondary | ICD-10-CM

## 2021-04-15 ENCOUNTER — Other Ambulatory Visit: Payer: Self-pay | Admitting: Internal Medicine

## 2021-04-15 DIAGNOSIS — E7800 Pure hypercholesterolemia, unspecified: Secondary | ICD-10-CM

## 2021-04-15 DIAGNOSIS — R079 Chest pain, unspecified: Secondary | ICD-10-CM

## 2021-04-15 DIAGNOSIS — F32A Depression, unspecified: Secondary | ICD-10-CM

## 2021-04-15 NOTE — Telephone Encounter (Signed)
She may have 6 refills of all 3 medications.  Please help her find a PCP if she does not have 1.  Thanks.

## 2021-04-15 NOTE — Telephone Encounter (Signed)
Please advise on refills.  

## 2021-09-22 ENCOUNTER — Other Ambulatory Visit: Payer: Medicare PPO

## 2021-10-06 ENCOUNTER — Encounter: Payer: Medicare PPO | Admitting: Internal Medicine
